# Patient Record
Sex: Male | Born: 1950 | Race: White | Hispanic: No | Marital: Married | Smoking: Former smoker
Health system: Southern US, Community
[De-identification: ages and names within clinical notes are randomized; demographics above are authoritative.]

## PROBLEM LIST (undated history)

## (undated) DIAGNOSIS — K649 Unspecified hemorrhoids: Secondary | ICD-10-CM

## (undated) DIAGNOSIS — Z9989 Dependence on other enabling machines and devices: Secondary | ICD-10-CM

## (undated) DIAGNOSIS — G4733 Obstructive sleep apnea (adult) (pediatric): Secondary | ICD-10-CM

## (undated) DIAGNOSIS — N189 Chronic kidney disease, unspecified: Secondary | ICD-10-CM

## (undated) DIAGNOSIS — D126 Benign neoplasm of colon, unspecified: Secondary | ICD-10-CM

## (undated) DIAGNOSIS — J302 Other seasonal allergic rhinitis: Secondary | ICD-10-CM

## (undated) DIAGNOSIS — Z973 Presence of spectacles and contact lenses: Secondary | ICD-10-CM

## (undated) DIAGNOSIS — F32A Depression, unspecified: Secondary | ICD-10-CM

## (undated) DIAGNOSIS — M5126 Other intervertebral disc displacement, lumbar region: Secondary | ICD-10-CM

## (undated) DIAGNOSIS — M109 Gout, unspecified: Secondary | ICD-10-CM

## (undated) DIAGNOSIS — K219 Gastro-esophageal reflux disease without esophagitis: Secondary | ICD-10-CM

## (undated) DIAGNOSIS — M1990 Unspecified osteoarthritis, unspecified site: Secondary | ICD-10-CM

## (undated) DIAGNOSIS — E7800 Pure hypercholesterolemia, unspecified: Secondary | ICD-10-CM

## (undated) DIAGNOSIS — N200 Calculus of kidney: Secondary | ICD-10-CM

## (undated) DIAGNOSIS — F329 Major depressive disorder, single episode, unspecified: Secondary | ICD-10-CM

## (undated) DIAGNOSIS — F419 Anxiety disorder, unspecified: Secondary | ICD-10-CM

## (undated) HISTORY — DX: Calculus of kidney: N20.0

## (undated) HISTORY — DX: Chronic kidney disease, unspecified: N18.9

## (undated) HISTORY — DX: Major depressive disorder, single episode, unspecified: F32.9

## (undated) HISTORY — DX: Dependence on other enabling machines and devices: Z99.89

## (undated) HISTORY — DX: Depression, unspecified: F32.A

## (undated) HISTORY — DX: Pure hypercholesterolemia, unspecified: E78.00

## (undated) HISTORY — DX: Obstructive sleep apnea (adult) (pediatric): G47.33

## (undated) HISTORY — DX: Anxiety disorder, unspecified: F41.9

## (undated) HISTORY — DX: Benign neoplasm of colon, unspecified: D12.6

## (undated) HISTORY — DX: Unspecified hemorrhoids: K64.9

## (undated) HISTORY — PX: COLONOSCOPY: SHX174

---

## 1994-05-21 HISTORY — PX: KIDNEY STONE SURGERY: SHX686

## 1994-05-21 HISTORY — PX: NASAL SINUS SURGERY: SHX719

## 2002-11-09 ENCOUNTER — Encounter (INDEPENDENT_AMBULATORY_CARE_PROVIDER_SITE_OTHER): Payer: Self-pay | Admitting: *Deleted

## 2002-11-09 ENCOUNTER — Ambulatory Visit (HOSPITAL_COMMUNITY): Admission: RE | Admit: 2002-11-09 | Discharge: 2002-11-09 | Payer: Self-pay | Admitting: Gastroenterology

## 2006-01-26 ENCOUNTER — Emergency Department (HOSPITAL_COMMUNITY): Admission: EM | Admit: 2006-01-26 | Discharge: 2006-01-26 | Payer: Self-pay | Admitting: Emergency Medicine

## 2006-05-21 HISTORY — PX: FINGER TENDON REPAIR: SHX1640

## 2007-02-02 ENCOUNTER — Inpatient Hospital Stay (HOSPITAL_COMMUNITY): Admission: EM | Admit: 2007-02-02 | Discharge: 2007-02-05 | Payer: Self-pay | Admitting: Emergency Medicine

## 2010-10-03 NOTE — Op Note (Signed)
NAMEJAMAREE, HOSIER            ACCOUNT NO.:  [id number]   MEDICAL RECORD NO.:  [id number]          PATIENT TYPE:  INP   LOCATION:  0104                         FACILITY:  [facility]   PHYSICIAN:  Madelynn Done, MD  DATE OF BIRTH:  Jan 23, 1951   DATE OF PROCEDURE:  02/02/2007  DATE OF DISCHARGE:                               OPERATIVE REPORT   ADDENDUM   DESCRIPTION OF PROCEDURE:  After, the flexor digitorum profundus was  then repaired without significant tension.  Attention was then returned  to revascularization of the finger.  Both the radial and ulnar digital  arteries were identified and the radial digital artery appeared to be in  better condition than the ulnar digital artery.  It was, therefore, felt  that a repair of the radial digital artery.  Using the microscope, the  radial digital artery was then carefully dissected free.  It was freed  up such that under loupe magnification we   Dictation ended at this point.      Madelynn Done, MD     FWO/MEDQ  D:  02/02/2007  T:  02/03/2007  Job:  [id number]

## 2010-10-03 NOTE — Op Note (Signed)
Zachary Alvarado, Zachary Alvarado            ACCOUNT NO.:  [id number]   MEDICAL RECORD NO.:  [id number]          PATIENT TYPE:  INP   LOCATION:  0104                         FACILITY:  [facility]   PHYSICIAN:  Madelynn Done, MD  DATE OF BIRTH:  1950-08-26   DATE OF PROCEDURE:  02/02/2007  DATE OF DISCHARGE:                               OPERATIVE REPORT   PREOPERATIVE DIAGNOSIS:  Left middle finger dysvascular digit from table  saw injury.   POSTOPERATIVE DIAGNOSIS:  Left middle finger dysvascular digit from  table saw injury.   ATTENDING SURGEON:  Dr. Bradly Bienenstock who was scrubbed and present for  the entire procedure.   ASSISTANT:  Dr. Onalee Hua who was scrubbed and present for the  revascularization.   PROCEDURE:  1. Left middle finger flexor digitorum superficialis repair, zone II.  2. Left middle finger zone II flexor digitorum profundus repair.  3. Left middle finger radial digital artery repair under the      microscope, microsurgical technique.  4. Left middle finger ulnar digital nerve repair.  5. Left middle finger radial digital nerve repair.   ANESTHESIA:  General via LMA.   TOURNIQUET TIME:  Total of an hour and 40 minutes.   SURGICAL INDICATIONS:  Mr. Luft is a 60 year old gentleman right-  handed who was at home using a table saw.  He sustained a transverse  laceration to his middle finger at the level of the middle phalanx.  The  patient was seen and evaluated in the emergency department and noted to  have poor circulation distal to his zone of injury as well as complete  anesthesia and no flexor digitorum profundus activity.  He was able to  flex the PIP joint with his other fingers held in extension.  Risks,  benefits and alternatives were discussed in detail with the patient and  signed informed consent was obtained.   INTRAOPERATIVE FINDINGS:  The patient had a complete laceration to both  neurovascular bundles.  The FDP was lacerated and retracted into  the  palm.  There was a near of 80% laceration of the both slips of the FDS  at the end of zone II.   Following repair of the digital artery, there was good flow distally to  the tip of the finger with good cap refill and skin turgor.   DESCRIPTION OF PROCEDURE:  The patient was brought identified in  preoperative holding area and a mark with a permanent marker was made on  the left middle finger to indicate correct operative site.  The patient  then brought back to the operating room, placed supine on the anesthesia  table.  General anesthesia was administered via endotracheal tube.  The  patient tolerated this well.  He received preoperative antibiotics prior  to any skin incision.  A well-padded tourniquet was then placed on the  left brachium and sealed with a 1000 drape.  The left upper extremity  was then prepped with Betadine and sterilely draped.  Time-out was  called, the correct side was identified and the procedure was then  begun.  The patient did have a  2.5 cm laceration across the middle  phalanx of the left middle finger.  This was extended both proximally  and distally along the radial ulnar borders in a Z type fashion.  This  allowed for adequate exposure of both neurovascular bundles well as the  flexor tendon sheath.  Attention was first turned to the repair of the  flexor digitorum superficialis.  Both slips were in continuity.  However, they were about 80% cut.  The slips of the FDS were then  reinforced with 4-0 FiberWire suture in a horizontal mattress fashion.  Following repair of the FDS tendon, attention was then turned to the  FDP.  It had retracted all the way down into the palm.  Therefore an  oblique incision was then made over the A1 pulley region.  The flexor  tendon was then identified.  Sutures were then placed into the tendon  and then using a feeding tube, the sutures were withdrawn distally,  maintaining the path in the course of the flexor digitorum  profundus.  After delivery of the tendon of the flexor digitorum profundus was then  repaired.  This was done with a modified Kessler technique with 4-0  FiberWire supplemented by a core horizantal mattress suture and 6.0  Prolene epitendinous suture.  There was not a significant amount of  tension on the repair.  Attention was then turned to repair of the  artery.   After the radial digital artery was identified, 2 small vessel clamps  were then applied both proximally and distally.  The tourniquet had been  deflated at this point.  There was good flow from the proximal end of  the artery.  The jagged ends of the artery were then trimmed back  sharply to get take back to healthy-appearing arterial tissue.  The  anastomosis was then performed from an end-to-end manner with 9-0 nylon  suture.  The ventral surface was applied which was sewn first; and then  the artery was flipped and turned; and then the back wall was then  repaired.  Following this repair with 9-0 nylon simple sutures, the  vessel clamps were then removed.  There was good blood distally; and not  a significant amount of bleeding at the arterial anastomosis.  There was  good skin turgor and good capillary refill.  During the arterial repair  lidocaine was used to infiltrate into the arterial wall itself, and then  papaverine was placed around the vessel to help decrease the spasm; and  the clot was extended the clot was removed both proximally and distally  with heparinized saline.   Following this the ulnar and radial digital nerves, which had been  isolated under the microscope as well, were then repaired with 3 nylon  epineurial sutures, simple sutures.  There was good reapproximation of  both digital nerves.  Following this, the finger was then thoroughly  irrigated.   Following repair of all the injured structures in the fingers, the  wounds were irrigated copiously.  The oblique incision in the palm was  then  closed with 5-0 nylon suture.  The laceration with the skin fixed,  the finger was then loosely reapproximated with 5-0 nylon suture.  Then  1/4% Marcaine 5 mL were then injected for a local digital block.  Adaptic dressing was then applied to the finger.  A sterile compressive  dressing was then applied, and a bulky hand splint was then made.  A  dorsal blocking splint all the way up to the  fingertips was then applied  to allow visibility of the fingertips.  The patient was extubated and  taken to recovery room in good condition.  The patient tolerated the  procedure well.  At the end of the procedure, there was good blood flow  to the fingertip with good and brisk capillary refill.   POSTOPERATIVE PLAN:  The patient will be admitted overnight, for the  next 48 hours.  Continue on enteric-coated aspirin, Dextran, warm room,  and revascularization precautions.  We will watch him closely; and then  if the fingertip does continue to survive, they will likely get into a  zone to flexor digitorum for tendon-repair protocol.      Madelynn Done, MD  Electronically Signed     FWO/MEDQ  D:  02/02/2007  T:  02/03/2007  Job:  [id number]

## 2010-10-03 NOTE — Op Note (Signed)
Zachary Alvarado, Zachary Alvarado            ACCOUNT NO.:  [id number]   MEDICAL RECORD NO.:  [id number]          PATIENT TYPE:  INP   LOCATION:  0104                         FACILITY:  [facility]   PHYSICIAN:  Madelynn Done, MD  DATE OF BIRTH:  [birth year]   DATE OF PROCEDURE:  02/02/2007  DATE OF DISCHARGE:                               OPERATIVE REPORT   ADDENDUM:   DESCRIPTION OF PROCEDURE:  After the radial digital artery was  identified, 2 small vessel clamps were then applied both proximally and  distally.  The tourniquet had been deflated at this point.  There was  good flow from the proximal end of the artery.  The jagged ends of the  artery were then trimmed back sharply to get take back to healthy-  appearing arterial tissue.  The anastomosis was then performed from an  end-to-end manner with 9-0 nylon suture.  The ventral surface was  applied which was sewn first; and then the artery was flipped and  turned; and then the back wall was then repaired.  Following this repair  with 9-0 nylon simple sutures, the vessel clamps were then removed.  There was good blood distally; and not a significant amount of bleeding  at the arterial anastomosis.  There was good skin turgor and good  capillary refill.   Following this the microscope was taken out.  The ulnar and radial  digital nerves, which had been isolated under the microscope as well,  were then repaired with 3 nylon epineurial sutures, simple sutures.  There was good reapproximation of both digital nerves.  Following this,  the finger was then thoroughly irrigated.  During the arterial repair  lidocaine was used to infiltrate into the arterial wall itself, and then  papaverine was placed around the vessel to help decrease the spasm; and  the clot was extended the clot was removed both proximally and distally  with heparinized saline.   Following repair of all the injured structures in the fingers, the  wounds were irrigated  copiously.  The oblique incision in the palm was  then closed with 5-0 nylon suture.  The laceration with the skin fixed,  the finger was then loosely reapproximated with 5-0 nylon suture.  Then  1/4% Marcaine 5 mL were then injected for a local digital block.  Adaptic dressing was then applied to the finger.  A sterile compressive  dressing was then applied, and a bulky hand splint was then made.  A  dorsal blocking splint all the way up to the fingertips was then applied  to allow visibility of the fingertips.  The patient was extubated and  taken to recovery room in good condition.  The patient tolerated the  procedure well.  At the end of the procedure, there was good blood flow  to the fingertip with good and brisk capillary refill.   POSTOPERATIVE PLAN:  The patient will be admitted overnight, for the  next 48 hours.  Continue on enteric-coated aspirin, Dextran, warm room,  and revascularization precautions.  We will watch him closely; and then  if the fingertip does continue to survive,  they will likely get into a  zone to flexor digitorum for tendon-repair protocol.      Madelynn Done, MD     FWO/MEDQ  D:  02/02/2007  T:  02/03/2007  Job:  [id number]

## 2010-10-03 NOTE — Discharge Summary (Signed)
Zachary Alvarado, YNIGUEZ            ACCOUNT NO.:  [id number]   MEDICAL RECORD NO.:  [id number]          PATIENT TYPE:  INP   LOCATION:  1607                         FACILITY:  [facility]   PHYSICIAN:  Madelynn Done, MD  DATE OF BIRTH:  05-Aug-1950   DATE OF ADMISSION:  02/02/2007  DATE OF DISCHARGE:  02/05/2007                               DISCHARGE SUMMARY   REASON FOR ADMISSION:  Left middle finger table saw injury with  dysvascular digit.   DISCHARGE DIAGNOSES:  Left middle finger table saw injury with injury to  nerves, arteries and tendons.   DISCHARGE MEDICATIONS:  1. Vicodin ES 1-2 tablets by mouth every 4-6 hours as needed for pain.  2. Enteric-coated aspirin 325 mg by mouth twice a day.  3. Colace 100 mg by mouth twice a day.   OPERATION/PROCEDURE:  Procedures and dates:  Left middle finger tendon, nerve and artery repair on February 02, 2007.   BRIEF HISTORY:  The patient is a 60 year old gentleman who sustained an  injury to his left middle finger while at home from a band saw.  He  presented to the emergency department with a dysvascular middle finger  from approximately the middle phalanx distally.  The patient was taken  to the operating room on the day of admission.   HOSPITAL COURSE:  The patient was admitted to the ortho floor following  the above procedure.  He felt tolerated this well under general  anesthesia.  Revascularization precautions were taken.  He was continued  on dextran, a warm room, no nicotine, and no caffeine.  He also was on  the aspirin.  He was continued on the IV pain medications, antibiotics  and Dextran through postoperative day #3.   Throughout his hospital course the fingertip remained well-perfused with  good capillary refill.  He was able to wiggle all digits.  He was  afebrile.  His vital signs were stable and normal throughout.  On  postoperative day #3 he was tolerating a regular diet, his pain was  controlled, the fingertip  was warm, and well-perfused; and, the patient  was ready to be discharged to home.   RECOMMENDATIONS AND DISPOSITION:  1. The patient is to be discharged to home.  2. I plan to see him back in the office in approximately 7-9 days.  3. The patient will continue with the above discharge medications.  4. The patient was given our telephone number and my cell phone number      so if he has any problems he is to give [place] a call.   All questions were addressed prior to his discharge.  The patient's was  questions were answered.  The patient voiced understanding of the  discharge instructions and the plan that we are to see him back in the  office.  He was instructed to call sooner if he has any worsening pain  or problems with that digit.   DISCHARGE CONDITION:  The condition at discharge is good.      Madelynn Done, MD  Electronically Signed     FWO/MEDQ  D:  02/05/2007  T:  02/06/2007  Job:  [id number]

## 2010-10-06 NOTE — Op Note (Signed)
Zachary Alvarado, Zachary Alvarado                        ACCOUNT NO.:  [id number]   MEDICAL RECORD NO.:  [id number]                   PATIENT TYPE:  AMB   LOCATION:  ENDO                                 FACILITY:  [facility]   PHYSICIAN:  Petra Kuba, M.D.                 DATE OF BIRTH:  [birth year]   DATE OF PROCEDURE:  11/09/2002  DATE OF DISCHARGE:                                 OPERATIVE REPORT   PROCEDURE PERFORMED:  Colonoscopy with polypectomy.   ENDOSCOPIST:  Petra Kuba, M.D.   INDICATIONS FOR PROCEDURE:  Screening.  History of colon polyps.  Due for  repeat screen.  Consent was signed after the risks, benefits, methods and  options were thoroughly discussed in the past.   MEDICINES USED:  Demerol 100 mg, Versed 10 mg.   DESCRIPTION OF PROCEDURE:  Rectal inspection was pertinent for small  external hemorrhoids.  Digital exam was negative.  A video colonoscope was  inserted and fairly easily advanced around the colon to the cecum.  This did  require some abdominal pressure and no position changes.  On insertion, no  obvious abnormality was seen.  The prep was better on the left than the  right.  There was some stool adherent to the wall on the right which could  not be washed off.  The cecum was identified by the appendiceal orifice and  the ileocecal valve.  The scope was slowly withdrawn.  The ileocecal  ascending and majority of the transverse were normal; however, in the  proximal level of the splenic flexure, a tiny polyp was seen, was hot  biopsied times one.  The scope was further withdrawn.  There was a rare left-  sided diverticula, but no other abnormality was seen as we withdrew back to  the rectum.  Anorectal pull-through and retroflexion confirmed some  hemorrhoids and ruled out any other etiology.  Scope was reinserted a short  ways up the left side of the colon.  Air was suctioned, scope removed.  The  patient tolerated the procedure well.  There was no immediate  obvious  complication.   ENDOSCOPIC DIAGNOSIS:  1. Small internal and external hemorrhoids.  2. Rare left-sided diverticula.  3. Tiny splenic flexure polyp hot biopsied.  4. Otherwise within normal limits to the cecum.    PLAN:  Await pathology.  Happy to see back p.r.n.  Otherwise return care to  Dr. Georgina Pillion for the customary health care maintenance to include yearly  rectals and guaiacs and repeat screening probably in five years.                                               Petra Kuba, M.D.    MEM/MEDQ  D:  11/09/2002  T:  11/09/2002  Job:  [id number]   cc:   Oley Balm. Georgina Pillion, M.D.  [address]  [place]  [place] [address]  Fax: [phone]

## 2011-03-01 LAB — BASIC METABOLIC PANEL
BUN: 7
CO2: 27
Calcium: 8.2 — ABNORMAL LOW
Chloride: 106
Creatinine, Ser: 1.19
GFR calc Af Amer: >60
GFR calc non Af Amer: >60
Glucose, Bld: 115 — ABNORMAL HIGH
Potassium: 3.6
Sodium: 138

## 2011-03-01 LAB — CBC
HCT: 33.3 — ABNORMAL LOW
Hemoglobin: 11.6 — ABNORMAL LOW
MCHC: 34.7
MCV: 89.8
Platelets: 192
RBC: 3.71 — ABNORMAL LOW
RDW: 12.3
WBC: 9.3

## 2011-03-02 LAB — COMPREHENSIVE METABOLIC PANEL
ALT: 24
AST: 22
Albumin: 3.9
Alkaline Phosphatase: 48
BUN: 15
CO2: 27
Calcium: 8.6
Chloride: 106
Creatinine, Ser: 1.22
GFR calc Af Amer: >60
GFR calc non Af Amer: >60
Glucose, Bld: 126 — ABNORMAL HIGH
Potassium: 4.5
Sodium: 138
Total Bilirubin: 0.9
Total Protein: 6.4

## 2011-03-02 LAB — CBC
HCT: 41
Hemoglobin: 14.5
MCHC: 35.2
MCV: 88.6
Platelets: 272
RBC: 4.63
RDW: 12.1
WBC: 9

## 2011-03-02 LAB — DIFFERENTIAL
Basophils Absolute: 0
Basophils Relative: 0
Eosinophils Absolute: 0.3
Eosinophils Relative: 3
Lymphocytes Relative: 12
Lymphs Abs: 1.1
Monocytes Absolute: 0.4
Monocytes Relative: 4
Neutro Abs: 7.2
Neutrophils Relative %: 80 — ABNORMAL HIGH

## 2011-03-02 LAB — PROTIME-INR
INR: 1.1
Prothrombin Time: 14.3

## 2011-03-02 LAB — APTT: aPTT: 26

## 2013-03-29 ENCOUNTER — Encounter: Payer: Self-pay | Admitting: Cardiology

## 2013-03-29 ENCOUNTER — Encounter: Payer: Self-pay | Admitting: Interventional Cardiology

## 2013-03-31 ENCOUNTER — Encounter (HOSPITAL_BASED_OUTPATIENT_CLINIC_OR_DEPARTMENT_OTHER): Payer: Self-pay | Admitting: *Deleted

## 2013-03-31 NOTE — Progress Notes (Signed)
No labs needed May have nasal packing-he does use a cpap with nose and mouth mask-he will bring it and will use post op. Has nasal polyps

## 2013-04-01 ENCOUNTER — Ambulatory Visit: Payer: Self-pay | Admitting: Cardiology

## 2013-04-01 ENCOUNTER — Encounter: Payer: Self-pay | Admitting: Cardiology

## 2013-04-01 DIAGNOSIS — E7800 Pure hypercholesterolemia, unspecified: Secondary | ICD-10-CM | POA: Insufficient documentation

## 2013-04-01 DIAGNOSIS — G4733 Obstructive sleep apnea (adult) (pediatric): Secondary | ICD-10-CM | POA: Insufficient documentation

## 2013-04-02 ENCOUNTER — Telehealth: Payer: Self-pay | Admitting: Cardiology

## 2013-04-02 NOTE — H&P (Signed)
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 Orders  CT Maxillofacial w/o contrast; Requested for: 03 Mar 2013. 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 We discussed different treatment options, antibiotics and steroids versus immediate surgery. He is inclined to consider revision surgery. He's done well for some years after his first operation. He wants to think about this and will let me know. We had a lengthy discussion about the surgery, the recovery, the risks of eye and brain injury. All questions were answered. Reason For Visit  Zachary Alvarado is here today at the kind request of none for consultation and opinion for sinus. HPI  History of polyp surgery in [place] about 20 years ago. He had done well until a few weeks ago when he started noticing pretty significant right-sided only nasal obstruction. He has some short periods of time when is clear but most of the time it's pretty obstructed. Nothing medicine-wise has helped. Allergies  Penicillins. Current Meds  Aspirin Low Dose 81 MG Oral Tablet;; RPT Carisoprodol TABS;; RPT Zoloft TABS (Sertraline HCl);; RPT Mobic TABS (Meloxicam);; RPT Red Yeast Rice CAPS;; RPT. PMH  History of renal calculi (V13.01) (Z87.442). PSH  Hand Surgery; finger Nose Surgery Oral Surgery Tooth Extraction. Family Hx  Family history of Alzheimer's disease: Father (V17.2) (Z82.0) Family history of malignant neoplasm: Mother (V16.9) (Z80.9) Seasonal allergies: Mother (J30.2). 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 ROS  Systemic: Not feeling tired (fatigue).  No fever, no night sweats, and no recent weight loss. Head: No headache. Eyes: No eye symptoms. Otolaryngeal: No hearing loss, no earache, no tinnitus, and no purulent nasal discharge.  Nasal passage blockage (stuffiness).  No snoring,  no sneezing, no hoarseness, and no sore throat. Cardiovascular: No chest pain or discomfort  and no palpitations. Pulmonary: No dyspnea, no cough, and no wheezing. Gastrointestinal: No dysphagia  and no heartburn.  No nausea, no abdominal pain, and no melena.  No diarrhea. Genitourinary: No dysuria. Endocrine: No muscle weakness. Musculoskeletal: No calf muscle cramps, no arthralgias, and no soft tissue swelling. Neurological: No dizziness, no fainting, no tingling, and no numbness. Psychological: No anxiety  and no depression. Skin: No rash. 12 system ROS was obtained and reviewed on the Health Maintenance form dated today.  Positive responses are shown above.  If the symptom is not checked, the patient has denied it. Vital Signs   Recorded by Skolimowski,Sharon on 03 Mar 2013 10:27 AM BP:110/70,  Height: 69 in, Weight: 185 lb, BMI: 27.3 kg/m2,  BMI Calculated: 27.32 ,  BSA Calculated: 2.00. Physical Exam  APPEARANCE: Well developed, well nourished, in no acute distress.  Normal affect, in a pleasant mood.  Oriented to time, place and person. COMMUNICATION: Normal voice   HEAD & FACE:  No scars, lesions or masses of head and face.  Sinuses nontender to palpation.  Salivary glands without mass or tenderness.  Facial strength symmetric.  No facial lesion, scars, or mass. EYES: EOMI with normal primary gaze alignment. Visual acuity grossly intact.  PERRLA EXTERNAL EAR & NOSE: No scars, lesions or masses  EAC & TYMPANIC MEMBRANE:  EAC shows no obstructing lesions or debris and tympanic membranes are normal bilaterally with good movement to insufflation. GROSS HEARING: Normal   TMJ:  Nontender  INTRANASAL EXAM: Left nasal cavity clear with healthy mucosa. Right side with polyps seen arising from the superior meatus  in the posterior nasal cavity. There may also be some arising from the middle meatus.  NASOPHARYNX: Normal, without lesions. LIPS, TEETH & GUMS: No lip lesions, normal dentition  and normal gums. ORAL CAVITY/OROPHARYNX:  Oral mucosa moist without lesion or asymmetry of the palate, tongue, tonsil or posterior pharynx. NECK:  Supple without adenopathy or mass. THYROID:  Normal with no masses palpable.  NEUROLOGIC:  No gross CN deficits. No nystagmus noted.   LYMPHATIC:  No enlarged nodes palpable. Results  CT reveals diffuse opacification of the right maxillary ethmoid and frontal sinus complex, with significant soft tissue density within the infundibulum, right nasal cavity and more mild disease on the left. Signature  Electronically signed by : Serena Colonel  M.D.; 03/03/2013 1:13 PM EST.

## 2013-04-02 NOTE — Telephone Encounter (Signed)
New Problem:  Pt states he is calling b/c he missed his appt and he was unaware of it. Pt states he thought he wasn't due for a checkup until months from now. Pt would like to be advised on when he is suppose to come in. Should he come in now or months from now. Also, Pt states,  could he send in sleep data without coming in for an appt. Please advise.Zachary Alvarado

## 2013-04-03 ENCOUNTER — Ambulatory Visit (HOSPITAL_BASED_OUTPATIENT_CLINIC_OR_DEPARTMENT_OTHER): Payer: BC Managed Care – PPO | Admitting: Anesthesiology

## 2013-04-03 ENCOUNTER — Encounter (HOSPITAL_BASED_OUTPATIENT_CLINIC_OR_DEPARTMENT_OTHER): Payer: Self-pay

## 2013-04-03 ENCOUNTER — Encounter (HOSPITAL_BASED_OUTPATIENT_CLINIC_OR_DEPARTMENT_OTHER)
Admission: RE | Disposition: A | Discharge status: Home / Self Care | Payer: Self-pay | Source: Ambulatory Visit | Attending: Otolaryngology

## 2013-04-03 ENCOUNTER — Encounter (HOSPITAL_BASED_OUTPATIENT_CLINIC_OR_DEPARTMENT_OTHER): Payer: BC Managed Care – PPO | Admitting: Anesthesiology

## 2013-04-03 ENCOUNTER — Ambulatory Visit (HOSPITAL_BASED_OUTPATIENT_CLINIC_OR_DEPARTMENT_OTHER)
Admission: RE | Admit: 2013-04-03 | Discharge: 2013-04-03 | Disposition: A | Discharge status: Home / Self Care | Payer: BC Managed Care – PPO | Source: Ambulatory Visit | Attending: Otolaryngology | Admitting: Otolaryngology

## 2013-04-03 DIAGNOSIS — J320 Chronic maxillary sinusitis: Secondary | ICD-10-CM | POA: Insufficient documentation

## 2013-04-03 DIAGNOSIS — J339 Nasal polyp, unspecified: Secondary | ICD-10-CM | POA: Insufficient documentation

## 2013-04-03 DIAGNOSIS — Z87442 Personal history of urinary calculi: Secondary | ICD-10-CM | POA: Insufficient documentation

## 2013-04-03 DIAGNOSIS — J329 Chronic sinusitis, unspecified: Secondary | ICD-10-CM

## 2013-04-03 HISTORY — PX: POLYPECTOMY: SHX149

## 2013-04-03 HISTORY — DX: Presence of spectacles and contact lenses: Z97.3

## 2013-04-03 HISTORY — PX: ETHMOIDECTOMY: SHX5197

## 2013-04-03 LAB — POCT HEMOGLOBIN-HEMACUE: Hemoglobin: 16 g/dL (ref 13.0–17.0)

## 2013-04-03 SURGERY — POLYPECTOMY, NASAL CAVITY
Anesthesia: General | Site: Nose | Wound class: Clean Contaminated

## 2013-04-03 MED ORDER — DEXAMETHASONE SODIUM PHOSPHATE 4 MG/ML IJ SOLN
INTRAMUSCULAR | Status: DC | PRN
  Administered 2013-04-03: 10 mg via INTRAVENOUS

## 2013-04-03 MED ORDER — EPHEDRINE SULFATE 50 MG/ML IJ SOLN
INTRAMUSCULAR | Status: DC | PRN
  Administered 2013-04-03 (×3): 10 mg via INTRAVENOUS

## 2013-04-03 MED ORDER — MIDAZOLAM HCL 5 MG/5ML IJ SOLN
INTRAMUSCULAR | Status: DC | PRN
  Administered 2013-04-03: 2 mg via INTRAVENOUS

## 2013-04-03 MED ORDER — HYDROCODONE-ACETAMINOPHEN 7.5-325 MG PO TABS: 1.0000 | ORAL_TABLET | Freq: Four times a day (QID) | ORAL | Status: DC | PRN

## 2013-04-03 MED ORDER — OXYMETAZOLINE HCL 0.05 % NA SOLN
NASAL | Status: DC | PRN
  Administered 2013-04-03: 1 via NASAL

## 2013-04-03 MED ORDER — LACTATED RINGERS IV SOLN
INTRAVENOUS | Status: DC
  Administered 2013-04-03 (×2): via INTRAVENOUS

## 2013-04-03 MED ORDER — OXYCODONE HCL 5 MG/5ML PO SOLN: 5.0000 mg | Freq: Once | ORAL | Status: DC | PRN

## 2013-04-03 MED ORDER — ONDANSETRON HCL 4 MG/2ML IJ SOLN
INTRAMUSCULAR | Status: DC | PRN
  Administered 2013-04-03: 4 mg via INTRAVENOUS

## 2013-04-03 MED ORDER — SUCCINYLCHOLINE CHLORIDE 20 MG/ML IJ SOLN
INTRAMUSCULAR | Status: DC | PRN
  Administered 2013-04-03: 100 mg via INTRAVENOUS

## 2013-04-03 MED ORDER — LIDOCAINE-EPINEPHRINE 1 %-1:100000 IJ SOLN
INTRAMUSCULAR | Status: DC | PRN
  Administered 2013-04-03: 8 mL

## 2013-04-03 MED ORDER — OXYMETAZOLINE HCL 0.05 % NA SOLN
NASAL | Status: AC
  Filled 2013-04-03: qty 15

## 2013-04-03 MED ORDER — PROPOFOL 10 MG/ML IV BOLUS
INTRAVENOUS | Status: DC | PRN
  Administered 2013-04-03: 200 mg via INTRAVENOUS

## 2013-04-03 MED ORDER — PROMETHAZINE HCL 25 MG RE SUPP: 25.0000 mg | Freq: Four times a day (QID) | RECTAL | Status: DC | PRN

## 2013-04-03 MED ORDER — PROPOFOL 10 MG/ML IV BOLUS
INTRAVENOUS | Status: AC
  Filled 2013-04-03: qty 20

## 2013-04-03 MED ORDER — FENTANYL CITRATE 0.05 MG/ML IJ SOLN
INTRAMUSCULAR | Status: AC
  Filled 2013-04-03: qty 4

## 2013-04-03 MED ORDER — OXYCODONE HCL 5 MG PO TABS: 5.0000 mg | ORAL_TABLET | Freq: Once | ORAL | Status: DC | PRN

## 2013-04-03 MED ORDER — FENTANYL CITRATE 0.05 MG/ML IJ SOLN
INTRAMUSCULAR | Status: DC | PRN
  Administered 2013-04-03: 50 ug via INTRAVENOUS
  Administered 2013-04-03: 100 ug via INTRAVENOUS

## 2013-04-03 MED ORDER — ONDANSETRON HCL 4 MG/2ML IJ SOLN: 4.0000 mg | Freq: Once | INTRAMUSCULAR | Status: DC | PRN

## 2013-04-03 MED ORDER — BACIT-POLY-NEO HC 1 % EX OINT
TOPICAL_OINTMENT | CUTANEOUS | Status: AC
  Filled 2013-04-03: qty 15

## 2013-04-03 MED ORDER — FENTANYL CITRATE 0.05 MG/ML IJ SOLN: 50.0000 ug | INTRAMUSCULAR | Status: DC | PRN

## 2013-04-03 MED ORDER — CLINDAMYCIN PHOSPHATE 600 MG/50ML IV SOLN
INTRAVENOUS | Status: DC | PRN
  Administered 2013-04-03: 600 mg via INTRAVENOUS

## 2013-04-03 MED ORDER — SODIUM CHLORIDE 0.9 % IV SOLN
INTRAVENOUS | Status: DC | PRN
  Administered 2013-04-03: 200 mL

## 2013-04-03 MED ORDER — CLINDAMYCIN HCL 300 MG PO CAPS: 300.0000 mg | ORAL_CAPSULE | Freq: Three times a day (TID) | ORAL | Status: DC

## 2013-04-03 MED ORDER — MIDAZOLAM HCL 2 MG/2ML IJ SOLN: 1.0000 mg | INTRAMUSCULAR | Status: DC | PRN

## 2013-04-03 MED ORDER — LIDOCAINE-EPINEPHRINE 1 %-1:100000 IJ SOLN
INTRAMUSCULAR | Status: AC
  Filled 2013-04-03: qty 1

## 2013-04-03 MED ORDER — CLINDAMYCIN PHOSPHATE 600 MG/50ML IV SOLN
INTRAVENOUS | Status: AC
  Filled 2013-04-03: qty 50

## 2013-04-03 MED ORDER — HYDROMORPHONE HCL PF 1 MG/ML IJ SOLN: 0.2500 mg | INTRAMUSCULAR | Status: DC | PRN

## 2013-04-03 MED ORDER — BACITRACIN ZINC 500 UNIT/GM EX OINT
TOPICAL_OINTMENT | CUTANEOUS | Status: AC
  Filled 2013-04-03: qty 9

## 2013-04-03 MED ORDER — MIDAZOLAM HCL 2 MG/2ML IJ SOLN
INTRAMUSCULAR | Status: AC
  Filled 2013-04-03: qty 2

## 2013-04-03 MED ORDER — BACIT-POLY-NEO HC 1 % EX OINT
TOPICAL_OINTMENT | CUTANEOUS | Status: DC | PRN
  Administered 2013-04-03: 1

## 2013-04-03 SURGICAL SUPPLY — 49 items
ATTRACTOMAT 16X20 MAGNETIC DRP (DRAPES) IMPLANT
BLADE RAD40 ROTATE 4M 4 5PK (BLADE) IMPLANT
BLADE RAD60 ROTATE M4 4 5PK (BLADE) IMPLANT
BLADE ROTATE RAD12 5PK M4 4MM (BLADE) IMPLANT
BLADE TRICUT ROTATE M4 4 5PK (BLADE) ×3 IMPLANT
BUR HS RAD FRONTAL 3 (BURR) IMPLANT
CANISTER SUC SOCK COL 7 IN (MISCELLANEOUS) ×3 IMPLANT
CANISTER SUCT 1200ML W/VALVE (MISCELLANEOUS) ×6 IMPLANT
CATH SINUS BALLN 7X16 (CATHETERS) IMPLANT
CATH SINUS GUIDE F-70 (CATHETERS) IMPLANT
CATH SINUS GUIDE M/110 (CATHETERS) IMPLANT
CATH SINUS IRRIGATION 2.0 (CATHETERS) IMPLANT
CORDS BIPOLAR (ELECTRODE) IMPLANT
DECANTER SPIKE VIAL GLASS SM (MISCELLANEOUS) IMPLANT
DRESSING ADAPTIC 1/2  N-ADH (PACKING) IMPLANT
DRESSING NASAL KENNEDY 3.5X.9 (MISCELLANEOUS) IMPLANT
DRSG NASAL KENNEDY 3.5X.9 (MISCELLANEOUS)
DRSG NASAL KENNEDY LMNT 8CM (GAUZE/BANDAGES/DRESSINGS) IMPLANT
DRSG NASOPORE 8CM (GAUZE/BANDAGES/DRESSINGS) ×3 IMPLANT
DRSG TELFA 3X8 NADH (GAUZE/BANDAGES/DRESSINGS) IMPLANT
GAUZE SPONGE 4X4 16PLY XRAY LF (GAUZE/BANDAGES/DRESSINGS) IMPLANT
GAUZE VASELINE FOILPK 1/2 X 72 (GAUZE/BANDAGES/DRESSINGS) IMPLANT
GLOVE ECLIPSE 7.5 STRL STRAW (GLOVE) ×3 IMPLANT
GLOVE SURG SS PI 7.0 STRL IVOR (GLOVE) ×3 IMPLANT
GOWN PREVENTION PLUS XLARGE (GOWN DISPOSABLE) ×6 IMPLANT
HEMOSTAT SURGICEL .5X2 ABSORB (HEMOSTASIS) IMPLANT
IV NS 1000ML (IV SOLUTION) ×1
IV NS 1000ML BAXH (IV SOLUTION) ×2 IMPLANT
IV NS 500ML (IV SOLUTION)
IV NS 500ML BAXH (IV SOLUTION) IMPLANT
NEEDLE 27GAX1X1/2 (NEEDLE) ×3 IMPLANT
NEEDLE SPNL 25GX3.5 QUINCKE BL (NEEDLE) IMPLANT
NS IRRIG 1000ML POUR BTL (IV SOLUTION) ×3 IMPLANT
PACK BASIN DAY SURGERY FS (CUSTOM PROCEDURE TRAY) ×3 IMPLANT
PACK ENT DAY SURGERY (CUSTOM PROCEDURE TRAY) ×3 IMPLANT
PATTIES SURGICAL .5 X3 (DISPOSABLE) ×3 IMPLANT
SLEEVE SCD COMPRESS KNEE MED (MISCELLANEOUS) ×3 IMPLANT
SOLUTION BUTLER CLEAR DIP (MISCELLANEOUS) ×3 IMPLANT
SPONGE GAUZE 2X2 8PLY STRL LF (GAUZE/BANDAGES/DRESSINGS) ×3 IMPLANT
SPONGE SURGIFOAM ABS GEL 12-7 (HEMOSTASIS) IMPLANT
SUT CHROMIC 4 0 P 3 18 (SUTURE) IMPLANT
SUT ETHILON 3 0 PS 1 (SUTURE) IMPLANT
SUT PLAIN 4 0 ~~LOC~~ 1 (SUTURE) IMPLANT
SUT SILK 3 0 PS 1 (SUTURE) IMPLANT
SYSTEM RELIEVA LUMA ILLUM (SINUPLASTY) IMPLANT
TOWEL OR 17X24 6PK STRL BLUE (TOWEL DISPOSABLE) ×3 IMPLANT
TUBE CONNECTING 20X1/4 (TUBING) ×3 IMPLANT
TUBE SALEM SUMP 16 FR W/ARV (TUBING) IMPLANT
YANKAUER SUCT BULB TIP NO VENT (SUCTIONS) ×3 IMPLANT

## 2013-04-03 NOTE — Anesthesia Preprocedure Evaluation (Signed)
Anesthesia Evaluation  Patient identified by MRN, date of birth, ID band Patient awake    Reviewed: Allergy & Precautions, H&P , NPO status , Patient's Chart, lab work & pertinent test results  Airway Mallampati: I TM Distance: >3 FB Neck ROM: Full    Dental  (+) Teeth Intact and Dental Advisory Given   Pulmonary sleep apnea and Continuous Positive Airway Pressure Ventilation ,  breath sounds clear to auscultation        Cardiovascular Rhythm:Regular Rate:Normal     Neuro/Psych    GI/Hepatic   Endo/Other    Renal/GU      Musculoskeletal   Abdominal   Peds  Hematology   Anesthesia Other Findings   Reproductive/Obstetrics                           Anesthesia Physical Anesthesia Plan  ASA: II  Anesthesia Plan: General   Post-op Pain Management:    Induction: Intravenous  Airway Management Planned: Oral ETT  Additional Equipment:   Intra-op Plan:   Post-operative Plan: Extubation in OR  Informed Consent: I have reviewed the patients History and Physical, chart, labs and discussed the procedure including the risks, benefits and alternatives for the proposed anesthesia with the patient or authorized representative who has indicated his/her understanding and acceptance.   Dental advisory given  Plan Discussed with: CRNA, Anesthesiologist and Surgeon  Anesthesia Plan Comments:         Anesthesia Quick Evaluation

## 2013-04-03 NOTE — Interval H&P Note (Signed)
History and Physical Interval Note:  04/03/2013 9:29 AM  Zachary Alvarado  has presented today for surgery, with the diagnosis of NASAL POLYPS AND CHRONIC MAXILLARY SINUSITIS  The various methods of treatment have been discussed with the patient and family. After consideration of risks, benefits and other options for treatment, the patient has consented to  Procedure(s): BILATERAL POLYPECTOMY NASAL (Bilateral) ETHMOIDECTOMY (N/A) as a surgical intervention .  The patient's history has been reviewed, patient examined, no change in status, stable for surgery.  I have reviewed the patient's chart and labs.  Questions were answered to the patient's satisfaction.     Erykah Lippert

## 2013-04-03 NOTE — Anesthesia Postprocedure Evaluation (Signed)
  Anesthesia Post-op Note  Patient: Zachary Alvarado  Procedure(s) Performed: Procedure(s): BILATERAL NASAL POLYPECTOMY  (Bilateral) ETHMOIDECTOMY (N/A)  Patient Location: PACU  Anesthesia Type:General  Level of Consciousness: awake, alert  and oriented  Airway and Oxygen Therapy: Patient Spontanous Breathing  Post-op Pain: none  Post-op Assessment: Post-op Vital signs reviewed  Post-op Vital Signs: Reviewed  Complications: No apparent anesthesia complications

## 2013-04-03 NOTE — Telephone Encounter (Signed)
LVM for pt to make aware he is due for a 1 year f/u with Dr Mayford Knife for CPAP machine (Sleep). He needs to come in for a visit and not just send in sleep info due to insurance reasons. We have to prove pt is compliant and he has to have at least one OV face to face with Provider yearly for insurance to keep paying for supplies and such. Told pt to call back and schedule appt when he can.

## 2013-04-03 NOTE — Transfer of Care (Signed)
Immediate Anesthesia Transfer of Care Note  Patient: Zachary Alvarado  Procedure(s) Performed: Procedure(s): BILATERAL NASAL POLYPECTOMY  (Bilateral) ETHMOIDECTOMY (N/A)  Patient Location: PACU  Anesthesia Type:General  Level of Consciousness: awake, alert  and oriented  Airway & Oxygen Therapy: Patient Spontanous Breathing and Patient connected to face mask oxygen  Post-op Assessment: Report given to PACU RN and Post -op Vital signs reviewed and stable  Post vital signs: Reviewed and stable  Complications: No apparent anesthesia complications

## 2013-04-03 NOTE — Anesthesia Procedure Notes (Signed)
Procedure Name: Intubation Date/Time: 04/03/2013 10:11 AM Performed by: Burna Cash Pre-anesthesia Checklist: Patient identified, Emergency Drugs available, Suction available and Patient being monitored Patient Re-evaluated:Patient Re-evaluated prior to inductionOxygen Delivery Method: Circle System Utilized Preoxygenation: Pre-oxygenation with 100% oxygen Intubation Type: IV induction Ventilation: Mask ventilation without difficulty Laryngoscope Size: Mac and 3 Grade View: Grade I Tube type: Oral Tube size: 8.0 mm Number of attempts: 1 Airway Equipment and Method: stylet and oral airway Placement Confirmation: ETT inserted through vocal cords under direct vision,  positive ETCO2 and breath sounds checked- equal and bilateral Secured at: 22 cm Tube secured with: Tape Dental Injury: Teeth and Oropharynx as per pre-operative assessment

## 2013-04-03 NOTE — Op Note (Signed)
OPERATIVE REPORT  DATE OF SURGERY: 04/03/2013  PATIENT:  Brunetta Genera,  62 y.o. male  PRE-OPERATIVE DIAGNOSIS:  NASAL POLYPS AND CHRONIC MAXILLARY SINUSITIS  POST-OPERATIVE DIAGNOSIS:  NASAL POLYPS AND CHRONIC MAXILLARY SINUSITIS  PROCEDURE:  Procedure(s): BILATERAL NASAL POLYPECTOMY  BILATERAL ENDOSCOPIC ETHMOIDECTOMY, LEFT ENDOSCOPIC MAXILLARY ANTROSTOMY  SURGEON:  Susy Frizzle, MD  ASSISTANTS: none  ANESTHESIA:   General   EBL:  50 ml  DRAINS: None   LOCAL MEDICATIONS USED:  1% Xylocaine with epinephrine  SPECIMEN:  Bilateral nasal and sinus contents  COUNTS:  Correct  PROCEDURE DETAILS: The patient was taken to the operating room and placed on the operating table in the supine position. Following induction of general endotracheal anesthesia the face was draped in a standard fashion. Oxymetazoline spray was used preoperatively in the nasal cavities. 1% Xylocaine with epinephrine was infiltrated into the posterior and superior attachments of the middle turbinates. The lateral nasal wall was also infiltrated. 1-bilateral endoscopic nasal polypectomy. Using a 0 nasal endoscope and the microdebrider, extensive polypoid disease was removed from the middle and superior meatus bilaterally. The middle turbinate was kept intact bilaterally.  2-bilateral endoscopic total ethmoidectomy. Using a combination of 0 and 30 nasal endoscopes, microdebrider and additional sinus instruments, a complete ethmoid dissection was accomplished bilaterally. There was extensive polypoid disease present throughout most of the ethmoid cells bilaterally. The lateral limit of dissection was the lamina papyracea which was kept intact. The superior limit was the fovea which was also kept intact. Frontal recess was dissected of polypoid disease bilaterally. The ground lamella was taken down bilaterally exposing posterior cells. At the end of the procedure, nasal poor packing was placed bilaterally in the  ethmoid cavity.  3-left endoscopic maxillary antrostomy with removal of tissue. The left maxillary sinus was completely filled with polypoid disease, and purulent material. This was suctioned out, and the angled curette was used to remove the diseased mucosa from the anterior part of the antrum. The left maxillary sinus was packed with bacitracin ointment at the end of the procedure. Patient was then awakened extubated and transferred to recovery in stable condition.    PATIENT DISPOSITION:  To PACU, stable

## 2013-04-06 ENCOUNTER — Encounter (HOSPITAL_BASED_OUTPATIENT_CLINIC_OR_DEPARTMENT_OTHER): Payer: Self-pay | Admitting: Otolaryngology

## 2013-04-08 ENCOUNTER — Encounter: Payer: Self-pay | Admitting: Cardiology

## 2013-04-08 ENCOUNTER — Ambulatory Visit (INDEPENDENT_AMBULATORY_CARE_PROVIDER_SITE_OTHER): Payer: BC Managed Care – PPO | Admitting: Cardiology

## 2013-04-08 VITALS — BP 119/83 | HR 75 | Ht 69.0 in | Wt 186.0 lb

## 2013-04-08 DIAGNOSIS — G4733 Obstructive sleep apnea (adult) (pediatric): Secondary | ICD-10-CM

## 2013-04-08 NOTE — Patient Instructions (Signed)
Your physician recommends that you continue on your current medications as directed. Please refer to the Current Medication list given to you today.  Your physician wants you to follow-up in: 1 year with Dr. Turner. You will receive a reminder letter in the mail two months in advance. If you don't receive a letter, please call our office to schedule the follow-up appointment.  

## 2013-04-08 NOTE — Progress Notes (Addendum)
[address] [place], [place]  [address] Phone: [phone] Fax:  [phone]  Date:  09/16/2020   ID:  Zachary Alvarado., DOB 23-Feb-1951, MRN [id number]  PCP:  Darrow Bussing, MD  Sleep Medicine:  Armanda Magic, MD    History of Present Illness: Zachary Alvarado. is a 62 y.o. male with a history of OSA who presents today for followup of his OSA.  He is doing well today.  He tolerates his CPAP device well.  He tolerate the mask and feels the pressure is adequate.  He feels rested when he gets up in the am and has no daytime sleepiness.  He uses a full face mask.  He recently had nasal polyp surgery and is doing well with recovery.   Wt Readings from Last 3 Encounters:  09/07/20 179 lb 3.2 oz (81.3 kg)  09/14/19 180 lb (81.6 kg)  09/12/18 179 lb (81.2 kg)       Current Outpatient Medications  Medication Sig Dispense Refill  . aspirin 81 MG tablet Take 81 mg by mouth daily.    Marland Kitchen azelastine (ASTELIN) 137 MCG/SPRAY nasal spray Place 2 sprays into both nostrils 2 (two) times daily as needed for allergies. Use in each nostril as directed    . carisoprodol (SOMA) 350 MG tablet Take 350 mg by mouth daily as needed for muscle spasms. Take as directed    . allopurinol (ZYLOPRIM) 100 MG tablet Take 100 mg by mouth daily.    . colchicine 0.6 MG tablet Take 0.6 mg by mouth 2 (two) times daily as needed (gout flare up). Gout flare up  0  . diclofenac sodium (VOLTAREN) 1 % GEL Apply 2 g topically daily as needed (pain).    . Multiple Vitamin (MULTIVITAMIN) tablet Take 1 tablet by mouth daily.    . Triprolidine-Pseudoephedrine (ANTIHISTAMINE PO) Take 1 tablet by mouth as directed.    Marland Kitchen VITAMIN D PO Take 4,000 Units by mouth daily.     No current facility-administered medications for this visit.    Allergies:    Allergies  Allergen Reactions  . Wasp Venom Swelling    Anything that stings will cause him to swell  . Percocet [Oxycodone-Acetaminophen] Other (See  Comments)    Dizziness   . Penicillins Rash    PATIENT ABLE TO TAKE KEFLEX W/O REACTION    Social History:  The patient  reports that he has never smoked. He has never used smokeless tobacco. He reports current alcohol use. He reports that he does not use drugs.   Family History:  The patient's family history includes Alzheimer's disease in his father; Hypertension in his sister; Leukemia in his mother.   ROS:  Please see the history of present illness.      All other systems reviewed and negative.   PHYSICAL EXAM: VS:  BP 119/83   Pulse 75   Ht 5\' 9"  (1.753 m)   Wt 186 lb (84.4 kg)   BMI 27.47 kg/m  Well nourished, well developed, in no acute distress HEENT: normal Neck: no JVD Cardiac:  normal S1, S2; RRR; no murmur Lungs:  clear to auscultation bilaterally, no wheezing, rhonchi or rales Abd: soft, nontender, no hepatomegaly Ext: no edema Skin: warm and dry Neuro:  CNs 2-12 intact, no focal abnormalities noted       ASSESSMENT AND PLAN:  1. OSA on CPAP - his download today showed an AHI of 4.4/hr on 11cm H2O and 84% compliance in  using more than 4 hours nightly.  I have encouraged him to get back into his exercise regimen.  Followup with me in 1 year  Signed, Armanda Magic, MD 09/16/2020 10:53 AM

## 2014-02-10 ENCOUNTER — Other Ambulatory Visit: Payer: Self-pay | Admitting: Dermatology

## 2014-02-23 ENCOUNTER — Encounter: Payer: Self-pay | Admitting: Cardiology

## 2014-12-27 ENCOUNTER — Encounter (HOSPITAL_COMMUNITY): Payer: Self-pay | Admitting: Emergency Medicine

## 2014-12-27 ENCOUNTER — Emergency Department (HOSPITAL_COMMUNITY)
Admission: EM | Admit: 2014-12-27 | Discharge: 2014-12-27 | Disposition: A | Discharge status: Home / Self Care | Payer: BC Managed Care – PPO | Attending: Emergency Medicine | Admitting: Emergency Medicine

## 2014-12-27 ENCOUNTER — Emergency Department (HOSPITAL_COMMUNITY): Payer: BC Managed Care – PPO

## 2014-12-27 DIAGNOSIS — J0101 Acute recurrent maxillary sinusitis: Secondary | ICD-10-CM | POA: Insufficient documentation

## 2014-12-27 DIAGNOSIS — N189 Chronic kidney disease, unspecified: Secondary | ICD-10-CM | POA: Diagnosis not present

## 2014-12-27 DIAGNOSIS — R42 Dizziness and giddiness: Secondary | ICD-10-CM | POA: Insufficient documentation

## 2014-12-27 DIAGNOSIS — Z7982 Long term (current) use of aspirin: Secondary | ICD-10-CM | POA: Insufficient documentation

## 2014-12-27 DIAGNOSIS — Z9981 Dependence on supplemental oxygen: Secondary | ICD-10-CM | POA: Insufficient documentation

## 2014-12-27 DIAGNOSIS — Z8639 Personal history of other endocrine, nutritional and metabolic disease: Secondary | ICD-10-CM | POA: Insufficient documentation

## 2014-12-27 DIAGNOSIS — R112 Nausea with vomiting, unspecified: Secondary | ICD-10-CM | POA: Diagnosis present

## 2014-12-27 DIAGNOSIS — Z79899 Other long term (current) drug therapy: Secondary | ICD-10-CM | POA: Insufficient documentation

## 2014-12-27 DIAGNOSIS — G4733 Obstructive sleep apnea (adult) (pediatric): Secondary | ICD-10-CM | POA: Insufficient documentation

## 2014-12-27 DIAGNOSIS — Z880 Allergy status to penicillin: Secondary | ICD-10-CM | POA: Diagnosis not present

## 2014-12-27 DIAGNOSIS — Z8719 Personal history of other diseases of the digestive system: Secondary | ICD-10-CM | POA: Insufficient documentation

## 2014-12-27 DIAGNOSIS — Z87442 Personal history of urinary calculi: Secondary | ICD-10-CM | POA: Diagnosis not present

## 2014-12-27 DIAGNOSIS — Z86018 Personal history of other benign neoplasm: Secondary | ICD-10-CM | POA: Diagnosis not present

## 2014-12-27 LAB — URINALYSIS, ROUTINE W REFLEX MICROSCOPIC
Bilirubin Urine: NEGATIVE
Glucose, UA: NEGATIVE mg/dL
Hgb urine dipstick: NEGATIVE
Ketones, ur: 15 mg/dL — AB
Leukocytes, UA: NEGATIVE
Nitrite: NEGATIVE
Protein, ur: NEGATIVE mg/dL
Specific Gravity, Urine: 1.02 (ref 1.005–1.030)
Urobilinogen, UA: 0.2 mg/dL (ref 0.0–1.0)
pH: 5.5 (ref 5.0–8.0)

## 2014-12-27 LAB — CBC
HCT: 43.8 % (ref 39.0–52.0)
Hemoglobin: 15.1 g/dL (ref 13.0–17.0)
MCH: 30.5 pg (ref 26.0–34.0)
MCHC: 34.5 g/dL (ref 30.0–36.0)
MCV: 88.5 fL (ref 78.0–100.0)
Platelets: 269 10*3/uL (ref 150–400)
RBC: 4.95 MIL/uL (ref 4.22–5.81)
RDW: 12.2 % (ref 11.5–15.5)
WBC: 9.2 10*3/uL (ref 4.0–10.5)

## 2014-12-27 LAB — LIPASE, BLOOD: Lipase: 17 U/L — ABNORMAL LOW (ref 22–51)

## 2014-12-27 LAB — COMPREHENSIVE METABOLIC PANEL
ALT: 28 U/L (ref 17–63)
AST: 23 U/L (ref 15–41)
Albumin: 4.3 g/dL (ref 3.5–5.0)
Alkaline Phosphatase: 66 U/L (ref 38–126)
Anion gap: 7 (ref 5–15)
BUN: 14 mg/dL (ref 6–20)
CO2: 25 mmol/L (ref 22–32)
Calcium: 9 mg/dL (ref 8.9–10.3)
Chloride: 106 mmol/L (ref 101–111)
Creatinine, Ser: 1.12 mg/dL (ref 0.61–1.24)
GFR calc Af Amer: >60 mL/min (ref >60)
GFR calc non Af Amer: >60 mL/min (ref >60)
Glucose, Bld: 159 mg/dL — ABNORMAL HIGH (ref 65–99)
Potassium: 4.4 mmol/L (ref 3.5–5.1)
Sodium: 138 mmol/L (ref 135–145)
Total Bilirubin: 1.2 mg/dL (ref 0.3–1.2)
Total Protein: 7.4 g/dL (ref 6.5–8.1)

## 2014-12-27 MED ORDER — ONDANSETRON HCL 4 MG PO TABS: 4.0000 mg | ORAL_TABLET | Freq: Four times a day (QID) | ORAL | Status: DC

## 2014-12-27 MED ORDER — MECLIZINE HCL 25 MG PO TABS
25.0000 mg | ORAL_TABLET | Freq: Once | ORAL | Status: AC
  Administered 2014-12-27: 25 mg via ORAL
  Filled 2014-12-27: qty 1

## 2014-12-27 MED ORDER — AZITHROMYCIN 250 MG PO TABS: 250.0000 mg | ORAL_TABLET | Freq: Every day | ORAL | Status: DC

## 2014-12-27 MED ORDER — AZITHROMYCIN 250 MG PO TABS
500.0000 mg | ORAL_TABLET | Freq: Once | ORAL | Status: AC
  Administered 2014-12-27: 500 mg via ORAL
  Filled 2014-12-27: qty 2

## 2014-12-27 MED ORDER — MECLIZINE HCL 25 MG PO TABS: 25.0000 mg | ORAL_TABLET | Freq: Three times a day (TID) | ORAL | Status: DC | PRN

## 2014-12-27 NOTE — Discharge Instructions (Signed)
Vertigo Vertigo means you feel like you or your surroundings are moving when they are not. Vertigo can be dangerous if it occurs when you are at work, driving, or performing difficult activities.  CAUSES  Vertigo occurs when there is a conflict of signals sent to your brain from the visual and sensory systems in your body. There are many different causes of vertigo, including:  Infections, especially in the inner ear.  A bad reaction to a drug or misuse of alcohol and medicines.  Withdrawal from drugs or alcohol.  Rapidly changing positions, such as lying down or rolling over in bed.  A migraine headache.  Decreased blood flow to the brain.  Increased pressure in the brain from a head injury, infection, tumor, or bleeding. SYMPTOMS  You may feel as though the world is spinning around or you are falling to the ground. Because your balance is upset, vertigo can cause nausea and vomiting. You may have involuntary eye movements (nystagmus). DIAGNOSIS  Vertigo is usually diagnosed by physical exam. If the cause of your vertigo is unknown, your caregiver may perform imaging tests, such as an MRI scan (magnetic resonance imaging). TREATMENT  Most cases of vertigo resolve on their own, without treatment. Depending on the cause, your caregiver may prescribe certain medicines. If your vertigo is related to body position issues, your caregiver may recommend movements or procedures to correct the problem. In rare cases, if your vertigo is caused by certain inner ear problems, you may need surgery. HOME CARE INSTRUCTIONS   Follow your caregiver's instructions.  Avoid driving.  Avoid operating heavy machinery.  Avoid performing any tasks that would be dangerous to you or others during a vertigo episode.  Tell your caregiver if you notice that certain medicines seem to be causing your vertigo. Some of the medicines used to treat vertigo episodes can actually make them worse in some people. SEEK  IMMEDIATE MEDICAL CARE IF:   Your medicines do not relieve your vertigo or are making it worse.  You develop problems with talking, walking, weakness, or using your arms, hands, or legs.  You develop severe headaches.  Your nausea or vomiting continues or gets worse.  You develop visual changes.  A family member notices behavioral changes.  Your condition gets worse. MAKE SURE YOU:  Understand these instructions.  Will watch your condition.  Will get help right away if you are not doing well or get worse. Document Released: 02/14/2005 Document Revised: 07/30/2011 Document Reviewed: 11/23/2010 [facility] Patient Information 2015 [place], [place]. This information is not intended to replace advice given to you by your health care provider. Make sure you discuss any questions you have with your health care provider. Sinusitis Sinusitis is redness, soreness, and inflammation of the paranasal sinuses. Paranasal sinuses are air pockets within the bones of your face (beneath the eyes, the middle of the forehead, or above the eyes). In healthy paranasal sinuses, mucus is able to drain out, and air is able to circulate through them by way of your nose. However, when your paranasal sinuses are inflamed, mucus and air can become trapped. This can allow bacteria and other germs to grow and cause infection. Sinusitis can develop quickly and last only a short time (acute) or continue over a long period (chronic). Sinusitis that lasts for more than 12 weeks is considered chronic.  CAUSES  Causes of sinusitis include:  Allergies.  Structural abnormalities, such as displacement of the cartilage that separates your nostrils (deviated septum), which can decrease the  air flow through your nose and sinuses and affect sinus drainage.  Functional abnormalities, such as when the small hairs (cilia) that line your sinuses and help remove mucus do not work properly or are not present. SIGNS AND SYMPTOMS    Symptoms of acute and chronic sinusitis are the same. The primary symptoms are pain and pressure around the affected sinuses. Other symptoms include:  Upper toothache.  Earache.  Headache.  Bad breath.  Decreased sense of smell and taste.  A cough, which worsens when you are lying flat.  Fatigue.  Fever.  Thick drainage from your nose, which often is green and may contain pus (purulent).  Swelling and warmth over the affected sinuses. DIAGNOSIS  Your health care provider will perform a physical exam. During the exam, your health care provider may:  Look in your nose for signs of abnormal growths in your nostrils (nasal polyps).  Tap over the affected sinus to check for signs of infection.  View the inside of your sinuses (endoscopy) using an imaging device that has a light attached (endoscope). If your health care provider suspects that you have chronic sinusitis, one or more of the following tests may be recommended:  Allergy tests.  Nasal culture. A sample of mucus is taken from your nose, sent to a lab, and screened for bacteria.  Nasal cytology. A sample of mucus is taken from your nose and examined by your health care provider to determine if your sinusitis is related to an allergy. TREATMENT  Most cases of acute sinusitis are related to a viral infection and will resolve on their own within 10 days. Sometimes medicines are prescribed to help relieve symptoms (pain medicine, decongestants, nasal steroid sprays, or saline sprays).  However, for sinusitis related to a bacterial infection, your health care provider will prescribe antibiotic medicines. These are medicines that will help kill the bacteria causing the infection.  Rarely, sinusitis is caused by a fungal infection. In theses cases, your health care provider will prescribe antifungal medicine. For some cases of chronic sinusitis, surgery is needed. Generally, these are cases in which sinusitis recurs more than 3  times per year, despite other treatments. HOME CARE INSTRUCTIONS   Drink plenty of water. Water helps thin the mucus so your sinuses can drain more easily.  Use a humidifier.  Inhale steam 3 to 4 times a day (for example, sit in the bathroom with the shower running).  Apply a warm, moist washcloth to your face 3 to 4 times a day, or as directed by your health care provider.  Use saline nasal sprays to help moisten and clean your sinuses.  Take medicines only as directed by your health care provider.  If you were prescribed either an antibiotic or antifungal medicine, finish it all even if you start to feel better. SEEK IMMEDIATE MEDICAL CARE IF:  You have increasing pain or severe headaches.  You have nausea, vomiting, or drowsiness.  You have swelling around your face.  You have vision problems.  You have a stiff neck.  You have difficulty breathing. MAKE SURE YOU:   Understand these instructions.  Will watch your condition.  Will get help right away if you are not doing well or get worse. Document Released: 05/07/2005 Document Revised: 09/21/2013 Document Reviewed: 05/22/2011 [facility] Patient Information 2015 [place], [place]. This information is not intended to replace advice given to you by your health care provider. Make sure you discuss any questions you have with your health care provider.

## 2014-12-27 NOTE — ED Notes (Signed)
Patient refusing IV.

## 2014-12-27 NOTE — ED Provider Notes (Signed)
CSN: [id number]     Arrival date & time 12/27/14  1122 History   First MD Initiated Contact with Patient 12/27/14 1539     Chief Complaint  Patient presents with  . Emesis  . Nausea  . Dizziness  . Exposure to rat      (Consider location/radiation/quality/duration/timing/severity/associated sxs/prior Treatment) HPI Comments: Patient with an uncomplicated medical history presents with sudden onset room-spinning dizziness after rolling over in bed last night. The episode was associated with nausea, vomiting and diaphoresis. Since onset, symptoms have improved and he reports much less dizziness and no nausea. No fever at any time. He denies chest pain, SOB, cough, headache or history of vertigo. He states he became concerned because he was exposed to rodent droppings and removed dead mice caught in set traps in his attic yesterday. He and his wife was concerned about a connection between his symptoms and possible viral illnesses associated with the exposure.  Patient is a 64 y.o. male presenting with vomiting and dizziness. The history is provided by the patient and the spouse. No language interpreter was used.  Emesis Severity:  Moderate Associated symptoms: no abdominal pain, no chills and no headaches   Dizziness Associated symptoms: nausea and vomiting   Associated symptoms: no headaches     Past Medical History  Diagnosis Date  . Depression   . Anxiety     social   . Adenomatous colon polyp     Dr Watt Climes 2004  . Wears glasses   . OSA on CPAP     withAHI 2/hr-titrated to 13cm H2O  . Chronic kidney disease   . Kidney stones, calcium oxalate   . Hemorrhoid   . Hypercholesterolemia    Past Surgical History  Procedure Laterality Date  . Nasal sinus surgery  1996  . Kidney stone surgery  1996    calcium oxalate kidney stone  . Colonoscopy    . Finger tendon repair  2008    and arteries-lt middle finger  . Polypectomy Bilateral 04/03/2013    Procedure: BILATERAL NASAL  POLYPECTOMY ;  Surgeon: Izora Gala, MD;  Location: [place];  Service: ENT;  Laterality: Bilateral;  . Ethmoidectomy N/A 04/03/2013    Procedure: ETHMOIDECTOMY;  Surgeon: Izora Gala, MD;  Location: [place];  Service: ENT;  Laterality: N/A;   Family History  Problem Relation Age of Onset  . Leukemia Mother   . Alzheimer's disease Father   . Hypertension Sister    History  Substance Use Topics  . Smoking status: Never Smoker   . Smokeless tobacco: Not on file  . Alcohol Use: Yes     Comment: 2 drinks daily    Review of Systems  Constitutional: Negative for fever and chills.  HENT: Negative.   Eyes: Negative for visual disturbance.  Respiratory: Negative.   Cardiovascular: Negative.   Gastrointestinal: Positive for nausea and vomiting. Negative for abdominal pain.  Musculoskeletal: Negative.   Skin: Negative.   Neurological: Positive for dizziness. Negative for syncope and headaches.      Allergies  Penicillins  Home Medications   Prior to Admission medications   Medication Sig Start Date End Date Taking? Authorizing Provider  aspirin 81 MG tablet Take 81 mg by mouth daily.    Historical Provider, MD  azelastine (ASTELIN) 137 MCG/SPRAY nasal spray Place into both nostrils 2 (two) times daily. Use in each nostril as directed    Historical Provider, MD  carisoprodol (SOMA) 350 MG tablet 350 mg. Take  as directed    Historical Provider, MD  Cetirizine HCl (ZYRTEC ALLERGY) 10 MG CAPS Take by mouth.    Historical Provider, MD  NON FORMULARY CPAP Machine as directed    Historical Provider, MD  psyllium (METAMUCIL) 58.6 % powder As neded for constipation    Historical Provider, MD  Red Yeast Rice 600 MG CAPS Take by mouth.    Historical Provider, MD  sertraline (ZOLOFT) 100 MG tablet Take 100 mg by mouth daily.    Historical Provider, MD   BP 150/89 mmHg  Pulse 73  Temp(Src) 97.8 F (36.6 C) (Oral)  Resp 16  SpO2 99% Physical Exam   Constitutional: He is oriented to person, place, and time. He appears well-developed and well-nourished.  HENT:  Head: Normocephalic and atraumatic.  Eyes: EOM are normal. Pupils are equal, round, and reactive to light.  Neck: Normal range of motion.  Cardiovascular: Normal rate and regular rhythm.   No murmur heard. Pulmonary/Chest: Effort normal and breath sounds normal. He has no wheezes. He has no rales.  Abdominal: Soft. There is no tenderness.  Neurological: He is alert and oriented to person, place, and time. He has normal strength and normal reflexes. No sensory deficit. He displays a negative Romberg sign.  No lateralizing weakness or deficits of coordination (finger-to-nose). Speech clear, focused and oriented. DTR's equal. Strength equal and 5/5 in extremities.     ED Course  Procedures (including critical care time) Labs Review Labs Reviewed  LIPASE, BLOOD - Abnormal; Notable for the following:    Lipase 17 (*)    All other components within normal limits  COMPREHENSIVE METABOLIC PANEL - Abnormal; Notable for the following:    Glucose, Bld 159 (*)    All other components within normal limits  CBC  URINALYSIS, ROUTINE W REFLEX MICROSCOPIC (NOT AT [facility])   Results for orders placed or performed during the hospital encounter of 12/27/14  Lipase, blood  Result Value Ref Range   Lipase 17 (L) 22 - 51 U/L  Comprehensive metabolic panel  Result Value Ref Range   Sodium 138 135 - 145 mmol/L   Potassium 4.4 3.5 - 5.1 mmol/L   Chloride 106 101 - 111 mmol/L   CO2 25 22 - 32 mmol/L   Glucose, Bld 159 (H) 65 - 99 mg/dL   BUN 14 6 - 20 mg/dL   Creatinine, Ser 1.12 0.61 - 1.24 mg/dL   Calcium 9.0 8.9 - 10.3 mg/dL   Total Protein 7.4 6.5 - 8.1 g/dL   Albumin 4.3 3.5 - 5.0 g/dL   AST 23 15 - 41 U/L   ALT 28 17 - 63 U/L   Alkaline Phosphatase 66 38 - 126 U/L   Total Bilirubin 1.2 0.3 - 1.2 mg/dL   GFR calc non Af Amer >60 >60 mL/min   GFR calc Af Amer >60 >60 mL/min   Anion  gap 7 5 - 15  CBC  Result Value Ref Range   WBC 9.2 4.0 - 10.5 K/uL   RBC 4.95 4.22 - 5.81 MIL/uL   Hemoglobin 15.1 13.0 - 17.0 g/dL   HCT 43.8 39.0 - 52.0 %   MCV 88.5 78.0 - 100.0 fL   MCH 30.5 26.0 - 34.0 pg   MCHC 34.5 30.0 - 36.0 g/dL   RDW 12.2 11.5 - 15.5 %   Platelets 269 150 - 400 K/uL  Urinalysis, Routine w reflex microscopic (not at [facility])  Result Value Ref Range   Color, Urine AMBER (A) YELLOW  APPearance CLEAR CLEAR   Specific Gravity, Urine 1.020 1.005 - 1.030   pH 5.5 5.0 - 8.0   Glucose, UA NEGATIVE NEGATIVE mg/dL   Hgb urine dipstick NEGATIVE NEGATIVE   Bilirubin Urine NEGATIVE NEGATIVE   Ketones, ur 15 (A) NEGATIVE mg/dL   Protein, ur NEGATIVE NEGATIVE mg/dL   Urobilinogen, UA 0.2 0.0 - 1.0 mg/dL   Nitrite NEGATIVE NEGATIVE   Leukocytes, UA NEGATIVE NEGATIVE   Ct Head Wo Contrast  12/27/2014   CLINICAL DATA:  Nausea, vomiting and dizziness beginning at 3 a.m. this morning.  EXAM: CT HEAD WITHOUT CONTRAST  TECHNIQUE: Contiguous axial images were obtained from the base of the skull through the vertex without intravenous contrast.  COMPARISON:  None.  FINDINGS: The brain appears normal without hemorrhage, infarct, mass lesion, mass effect, midline shift or abnormal extra-axial fluid collection. No hydrocephalus or pneumocephalus. The calvarium is intact.  Marked mucosal thickening is seen in the maxillary sinuses bilaterally, worse on the right, where an air-fluid level is identified. Mucosal thickening is also seen in scattered ethmoid air cells and in the frontal sinuses. The mastoid air cells are clear.  IMPRESSION: No acute intracranial abnormality.  Marked sinus disease with an air-fluid level in the right maxillary compatible with acute sinusitis.   Electronically Signed   By: Inge Rise M.D.   On: 12/27/2014 17:27    Imaging Review No results found.   EKG Interpretation None      MDM   Final diagnoses:  None    1. Acute sinusitis 2.  Vertigo  He continues to feel improved through duration of ED visit. No neurologic deficits on exam. Plan to treat with Meclizine and with Zithromax z-pack for sinusitis, likely contributing to symptoms of dizziness. He has follow up available with Dr. Dorthy Cooler and is encouraged to be seen this week for recheck.     Charlann Lange, PA-C 12/27/14 1731  Charlesetta Shanks, MD 01/01/15 [phone]

## 2014-12-27 NOTE — ED Notes (Signed)
Patient given Sprite to drink. Will finish that and will ambulate to the restroom to give urine sample.

## 2014-12-27 NOTE — ED Notes (Signed)
Patient transported to CT 

## 2014-12-27 NOTE — ED Notes (Signed)
Pt stated he only feels dizzy when he gets up.He denies any chest pain or feeling short of breath. Pt was given a sprite to drink . Tolerating well. Lungs clear in all fields and abd soft and nontender.

## 2014-12-27 NOTE — ED Notes (Addendum)
Pt c/o N/V/dizziness starting at 0300 this morning. Diaphoresis noted when he woke up this morning. Denies fevers. Says the N/V/Dizziness is more upon sudden movement. No hx vertigo or cardiac issues. Did clean up a dead mouse in the attic this morning and is concerned for virus contracted from rodents. Also vacuumed up rat droppings last night and read this could "aerosolize the virus." Did have gloves on when handling dead mouse. No other c/c.

## 2015-05-22 DIAGNOSIS — M5126 Other intervertebral disc displacement, lumbar region: Secondary | ICD-10-CM

## 2015-05-22 HISTORY — DX: Other intervertebral disc displacement, lumbar region: M51.26

## 2015-11-03 ENCOUNTER — Other Ambulatory Visit: Payer: Self-pay | Admitting: Family Medicine

## 2015-11-03 ENCOUNTER — Ambulatory Visit
Admission: RE | Admit: 2015-11-03 | Discharge: 2015-11-03 | Disposition: A | Discharge status: Home / Self Care | Payer: BC Managed Care – PPO | Source: Ambulatory Visit | Attending: Family Medicine | Admitting: Family Medicine

## 2015-11-03 DIAGNOSIS — Z01818 Encounter for other preprocedural examination: Secondary | ICD-10-CM

## 2015-12-28 NOTE — H&P (Signed)
Zachary Alvarado is an 65 y.o. male.    Chief Complaint: left shoulder pain  HPI: Pt is a 65 y.o. male complaining of left shoulder pain for multiple years. Pain had continually increased since the beginning. X-rays in the clinic show end-stage arthritic changes of the left shoulder. Pt has tried various conservative treatments which have failed to alleviate their symptoms, including injections and therapy. Various options are discussed with the patient. Risks, benefits and expectations were discussed with the patient. Patient understand the risks, benefits and expectations and wishes to proceed with surgery.   PCP:  Lujean Amel, MD  D/C Plans: Home  PMH: Past Medical History:  Diagnosis Date  . Adenomatous colon polyp    Dr Watt Climes 2004  . Anxiety    social   . Chronic kidney disease   . Depression   . Hemorrhoid   . Hypercholesterolemia   . Kidney stones, calcium oxalate   . OSA on CPAP    withAHI 2/hr-titrated to 13cm H2O  . Wears glasses     PSH: Past Surgical History:  Procedure Laterality Date  . COLONOSCOPY    . ETHMOIDECTOMY N/A 04/03/2013   Procedure: ETHMOIDECTOMY;  Surgeon: Izora Gala, MD;  Location: [place];  Service: ENT;  Laterality: N/A;  . FINGER TENDON REPAIR  2008   and arteries-lt middle finger  . KIDNEY STONE SURGERY  1996   calcium oxalate kidney stone  . NASAL SINUS SURGERY  1996  . POLYPECTOMY Bilateral 04/03/2013   Procedure: BILATERAL NASAL POLYPECTOMY ;  Surgeon: Izora Gala, MD;  Location: [place];  Service: ENT;  Laterality: Bilateral;    Social History:  reports that he has never smoked. He does not have any smokeless tobacco history on file. He reports that he drinks alcohol. He reports that he does not use drugs.  Allergies:  Allergies  Allergen Reactions  . Penicillins     RASH but pt can take keflex without problems     Medications: No current facility-administered medications for this  encounter.    Current Outpatient Prescriptions  Medication Sig Dispense Refill  . aspirin 81 MG tablet Take 81 mg by mouth daily.    Marland Kitchen azelastine (ASTELIN) 137 MCG/SPRAY nasal spray Place 2 sprays into both nostrils 2 (two) times daily as needed for allergies. Use in each nostril as directed    . azithromycin (ZITHROMAX Z-PAK) 250 MG tablet Take 1 tablet (250 mg total) by mouth daily. 4 tablet 0  . carisoprodol (SOMA) 350 MG tablet Take 350 mg by mouth daily as needed for muscle spasms. Take as directed    . Cetirizine HCl (ZYRTEC ALLERGY) 10 MG CAPS Take 1 capsule by mouth daily as needed (allergies).     . colchicine 0.6 MG tablet Take 0.6 mg by mouth 2 (two) times daily. Gout flare up  0  . meclizine (ANTIVERT) 25 MG tablet Take 1 tablet (25 mg total) by mouth 3 (three) times daily as needed. 21 tablet 0  . meloxicam (MOBIC) 15 MG tablet Take 1 tablet by mouth daily as needed. Muscle spasms/pain  2  . NON FORMULARY CPAP Machine as directed    . ondansetron (ZOFRAN) 4 MG tablet Take 1 tablet (4 mg total) by mouth every 6 (six) hours. 12 tablet 0  . psyllium (METAMUCIL) 58.6 % powder Take 1 packet by mouth daily as needed (pt said he was taking it for cholestrol control). As neded for constipation      No  results found for this or any previous visit (from the past 64 hour(s)). No results found.  ROS: Pain with rom of the left upper extremity  Physical Exam:  Alert and oriented 65 y.o. male in no acute distress Cranial nerves 2-12 intact Cervical spine: full rom with no tenderness, nv intact distally Chest: active breath sounds bilaterally, no wheeze rhonchi or rales Heart: regular rate and rhythm, no murmur Abd: non tender non distended with active bowel sounds Hip is stable with rom  Left shoulder limited rom due to pain and guarding nv intact distally No rashes or edema Cuff intact with mild strength deficit  Assessment/Plan Assessment: left shoulder end stage  osteoarthritis  Plan: Patient will undergo a left total shoulder arthroplasty by Dr. [name] at [facility]. Risks benefits and expectations were discussed with the patient. Patient understand risks, benefits and expectations and wishes to proceed.

## 2016-01-06 NOTE — Pre-Procedure Instructions (Addendum)
INDIE MATKOVICH  01/06/2016      [id number] - [place], [place] - [address] [place] [address] Phone: [phone] Fax: [phone]  [facility] [place], [place] - [address] [place] [address] [place] [address] Phone: [phone] Fax: [phone]    Your procedure is scheduled on  Monday, August 28.   Report to [facility] Admitting at 9:45 A.M.             ( Your surgery or procedure is scheduled for 11:45am - 2:25 pm)   Call this number if you have problems the morning of surgery:[phone]                For any other questions, please call [phone], Monday - Friday 8 AM - 4 PM.   Remember:  Do not eat food or drink liquids after midnight Sunday, August 27.   Take these medicines the morning of surgery with A SIP OF WATER : Take if needed: colchicine.                 1 Week prior to surgery STOP taking Aspirin , Aspirin Products (Goody Powder, Excedrin Migraine), Ibuprofen (Advil), Naproxen (Aleve), Viatiams and Herbal Products (ie Fish Oil)                      [place]- Preparing For Surgery  Before surgery, you can play an important role. Because skin is not sterile, your skin needs to be as free of germs as possible. You can reduce the number of germs on your skin by washing with CHG (chlorahexidine gluconate) Soap before surgery.  CHG is an antiseptic cleaner which kills germs and bonds with the skin to continue killing germs even after washing.  Please do not use if you have an allergy to CHG or antibacterial soaps. If your skin becomes reddened/irritated stop using the CHG.  Do not shave (including legs and underarms) for at least 48 hours prior to first CHG shower. It is OK to shave your face.  Please follow these instructions carefully.   1. Shower the NIGHT BEFORE SURGERY and the MORNING OF SURGERY  with CHG.   2. If you chose to wash your hair, wash your hair first as usual with your normal shampoo.  3. After you shampoo, rinse your hair and body thoroughly to remove the shampoo.  4. Use CHG as you would any other liquid soap. You can apply CHG directly to the skin and wash gently with a scrungie or a clean washcloth.   5. Apply the CHG Soap to your body ONLY FROM THE NECK DOWN.  Do not use on open wounds or open sores. Avoid contact with your eyes, ears, mouth and genitals (private parts). Wash genitals (private parts) with your normal soap.  6. Wash thoroughly, paying special attention to the area where your surgery will be performed.  7. Thoroughly rinse your body with warm water from the neck down.  8. DO NOT shower/wash with your normal soap after using and rinsing off the CHG Soap.  9. Pat yourself dry with a CLEAN TOWEL.   10. Wear CLEAN PAJAMAS   11. Place CLEAN SHEETS on your bed the night of your first shower and DO NOT SLEEP WITH PETS.  Day of Surgery: Do  not apply any deodorants/lotions. Please wear clean clothes to the hospital/surgery center.                    Do not wear jewelry, make-up or nail polish.  Do not wear lotions, powders, or perfumes. DO NOT wear deoderant.   Men may shave face and neck.  Do not bring valuables to the hospital.  [facility] is not responsible for any belongings or valuables.  Contacts, dentures or bridgework may not be worn into surgery.  Leave your suitcase in the car.  After surgery it may be brought to your room.  For patients admitted to the hospital, discharge time will be determined by your treatment team.  Patients discharged the day of surgery will not be allowed to drive home.   Name and phone number of your driver: - Special instructions:  -  Please read over the following fact sheets that you were given.  Patient Instructions for Mupirocin Application, Incentive Spirometry.

## 2016-01-09 ENCOUNTER — Other Ambulatory Visit: Payer: Self-pay

## 2016-01-09 ENCOUNTER — Encounter (HOSPITAL_COMMUNITY): Payer: Self-pay

## 2016-01-09 ENCOUNTER — Encounter (HOSPITAL_COMMUNITY)
Admission: RE | Admit: 2016-01-09 | Discharge: 2016-01-09 | Disposition: A | Discharge status: Home / Self Care | Payer: Medicare Other | Source: Ambulatory Visit | Attending: Orthopedic Surgery | Admitting: Orthopedic Surgery

## 2016-01-09 DIAGNOSIS — Z01818 Encounter for other preprocedural examination: Secondary | ICD-10-CM | POA: Diagnosis not present

## 2016-01-09 DIAGNOSIS — Z01812 Encounter for preprocedural laboratory examination: Secondary | ICD-10-CM | POA: Diagnosis not present

## 2016-01-09 DIAGNOSIS — M19012 Primary osteoarthritis, left shoulder: Secondary | ICD-10-CM | POA: Diagnosis not present

## 2016-01-09 HISTORY — DX: Gout, unspecified: M10.9

## 2016-01-09 HISTORY — DX: Unspecified osteoarthritis, unspecified site: M19.90

## 2016-01-09 HISTORY — DX: Other seasonal allergic rhinitis: J30.2

## 2016-01-09 HISTORY — DX: Gastro-esophageal reflux disease without esophagitis: K21.9

## 2016-01-09 LAB — CBC
HCT: 45 % (ref 39.0–52.0)
Hemoglobin: 15 g/dL (ref 13.0–17.0)
MCH: 30 pg (ref 26.0–34.0)
MCHC: 33.3 g/dL (ref 30.0–36.0)
MCV: 90 fL (ref 78.0–100.0)
Platelets: 263 10*3/uL (ref 150–400)
RBC: 5 MIL/uL (ref 4.22–5.81)
RDW: 12.3 % (ref 11.5–15.5)
WBC: 7.7 10*3/uL (ref 4.0–10.5)

## 2016-01-09 LAB — BASIC METABOLIC PANEL
Anion gap: 7 (ref 5–15)
BUN: 11 mg/dL (ref 6–20)
CO2: 24 mmol/L (ref 22–32)
Calcium: 9.3 mg/dL (ref 8.9–10.3)
Chloride: 107 mmol/L (ref 101–111)
Creatinine, Ser: 1.25 mg/dL — ABNORMAL HIGH (ref 0.61–1.24)
GFR calc Af Amer: >60 mL/min (ref >60)
GFR calc non Af Amer: 59 mL/min — ABNORMAL LOW (ref >60)
Glucose, Bld: 108 mg/dL — ABNORMAL HIGH (ref 65–99)
Potassium: 4.1 mmol/L (ref 3.5–5.1)
Sodium: 138 mmol/L (ref 135–145)

## 2016-01-09 LAB — SURGICAL PCR SCREEN
MRSA, PCR: NEGATIVE
Staphylococcus aureus: NEGATIVE

## 2016-01-13 MED ORDER — CLINDAMYCIN PHOSPHATE 900 MG/50ML IV SOLN: 900.0000 mg | INTRAVENOUS | Status: AC

## 2016-02-06 DIAGNOSIS — M5416 Radiculopathy, lumbar region: Secondary | ICD-10-CM | POA: Diagnosis not present

## 2016-02-06 DIAGNOSIS — Z23 Encounter for immunization: Secondary | ICD-10-CM | POA: Diagnosis not present

## 2016-02-13 DIAGNOSIS — L821 Other seborrheic keratosis: Secondary | ICD-10-CM | POA: Diagnosis not present

## 2016-02-13 DIAGNOSIS — D1801 Hemangioma of skin and subcutaneous tissue: Secondary | ICD-10-CM | POA: Diagnosis not present

## 2016-02-13 DIAGNOSIS — L578 Other skin changes due to chronic exposure to nonionizing radiation: Secondary | ICD-10-CM | POA: Diagnosis not present

## 2016-02-13 DIAGNOSIS — Z23 Encounter for immunization: Secondary | ICD-10-CM | POA: Diagnosis not present

## 2016-02-13 DIAGNOSIS — L814 Other melanin hyperpigmentation: Secondary | ICD-10-CM | POA: Diagnosis not present

## 2016-02-13 DIAGNOSIS — D225 Melanocytic nevi of trunk: Secondary | ICD-10-CM | POA: Diagnosis not present

## 2016-02-13 DIAGNOSIS — Z85828 Personal history of other malignant neoplasm of skin: Secondary | ICD-10-CM | POA: Diagnosis not present

## 2016-02-13 DIAGNOSIS — M545 Low back pain: Secondary | ICD-10-CM | POA: Diagnosis not present

## 2016-02-13 DIAGNOSIS — M5416 Radiculopathy, lumbar region: Secondary | ICD-10-CM | POA: Diagnosis not present

## 2016-02-16 NOTE — H&P (Signed)
Zachary Alvarado is an 65 y.o. male.    Chief Complaint: left shoulder pain  HPI: Pt is a 65 y.o. male complaining of left shoulder pain for multiple years. Pain had continually increased since the beginning. X-rays in the clinic show end-stage arthritic changes of the left shoulder. Pt has tried various conservative treatments which have failed to alleviate their symptoms, including injections and therapy. Various options are discussed with the patient. Risks, benefits and expectations were discussed with the patient. Patient understand the risks, benefits and expectations and wishes to proceed with surgery.   PCP:  Zachary Amel, MD  D/C Plans: Home  PMH: Past Medical History:  Diagnosis Date  . Adenomatous colon polyp    Dr Watt Climes 2004  . Anxiety    social   . Arthritis   . Chronic kidney disease   . Depression    at one point in his life  . GERD (gastroesophageal reflux disease)    occasionally, take OTC meds x 14 days  . Gout of big toe    in right foot  . Hemorrhoid   . Hypercholesterolemia   . Kidney stones, calcium oxalate   . OSA on CPAP    withAHI 2/hr-titrated to 13cm H2O  . OSA on CPAP    tested more than 5-6 yrs ago ([place] of [place])  . Seasonal allergies   . Wears glasses     PSH: Past Surgical History:  Procedure Laterality Date  . COLONOSCOPY    . ETHMOIDECTOMY N/A 04/03/2013   Procedure: ETHMOIDECTOMY;  Surgeon: Zachary Gala, MD;  Location: [place];  Service: ENT;  Laterality: N/A;  . FINGER TENDON REPAIR  2008   and arteries-lt middle finger  . KIDNEY STONE SURGERY  1996   calcium oxalate kidney stone  . NASAL SINUS SURGERY  1996  . POLYPECTOMY Bilateral 04/03/2013   Procedure: BILATERAL NASAL POLYPECTOMY ;  Surgeon: Zachary Gala, MD;  Location: [place];  Service: ENT;  Laterality: Bilateral;    Social History:  reports that he has never smoked. He has never used smokeless tobacco. He reports that he  drinks alcohol. He reports that he does not use drugs.  Allergies:  Allergies  Allergen Reactions  . Wasp Venom Swelling    Anything that stings will cause him to swell  . Penicillins Rash    PATIENT ABLE TO TAKE KEFLEX W/O REACTION    Medications: No current facility-administered medications for this encounter.    Current Outpatient Prescriptions  Medication Sig Dispense Refill  . aspirin 81 MG tablet Take 81 mg by mouth daily.    Marland Kitchen azelastine (ASTELIN) 137 MCG/SPRAY nasal spray Place 2 sprays into both nostrils 2 (two) times daily as needed for allergies. Use in each nostril as directed    . CALCIUM-VITAMIN D PO Take 1 tablet by mouth daily.    . carisoprodol (SOMA) 350 MG tablet Take 350 mg by mouth daily as needed for muscle spasms. Take as directed    . colchicine 0.6 MG tablet Take 0.6 mg by mouth 2 (two) times daily as needed (gout flare up). Gout flare up  0  . diclofenac sodium (VOLTAREN) 1 % GEL Apply 1 application topically 3 (three) times daily.  0  . glucosamine-chondroitin 500-400 MG tablet Take 1 tablet by mouth daily.    . NON FORMULARY CPAP Machine as directed    . psyllium (METAMUCIL) 58.6 % powder Take 1 packet by mouth daily as needed (pt  said he was taking it for cholestrol control). As neded for constipation    . triamcinolone cream (KENALOG) 0.1 % Apply 1 application topically 2 (two) times daily as needed (rash -- in addition to lotion).    . triamcinolone lotion (KENALOG) 0.1 % Apply 1 application topically 3 (three) times daily.    Marland Kitchen azithromycin (ZITHROMAX Z-PAK) 250 MG tablet Take 1 tablet (250 mg total) by mouth daily. (Patient not taking: Reported on 01/04/2016) 4 tablet 0  . meclizine (ANTIVERT) 25 MG tablet Take 1 tablet (25 mg total) by mouth 3 (three) times daily as needed. (Patient not taking: Reported on 01/06/2016) 21 tablet 0  . ondansetron (ZOFRAN) 4 MG tablet Take 1 tablet (4 mg total) by mouth every 6 (six) hours. (Patient not taking: Reported on  01/06/2016) 12 tablet 0    No results found for this or any previous visit (from the past 48 hour(s)). No results found.  ROS: Pain with rom of the left upper extremity Recent lumbar back pain  Physical Exam:  Alert and oriented 65 y.o. male in no acute distress Cranial nerves 2-12 intact Cervical spine: full rom with no tenderness, nv intact distally Chest: active breath sounds bilaterally, no wheeze rhonchi or rales Heart: regular rate and rhythm, no murmur Abd: non tender non distended with active bowel sounds Hip is stable with rom  Left shoulder crepitus with rom Strength of ER and IR 5/5 bilaterally No rashes or edema  Assessment/Plan Assessment: left shoulder end stage osteoarthritis  Plan: Patient will undergo a left total shoulder by Dr. Veverly Alvarado at [facility]. Risks benefits and expectations were discussed with the patient. Patient understand risks, benefits and expectations and wishes to proceed.

## 2016-02-17 DIAGNOSIS — M5117 Intervertebral disc disorders with radiculopathy, lumbosacral region: Secondary | ICD-10-CM | POA: Diagnosis not present

## 2016-02-17 DIAGNOSIS — M5136 Other intervertebral disc degeneration, lumbar region: Secondary | ICD-10-CM | POA: Diagnosis not present

## 2016-03-01 ENCOUNTER — Encounter (HOSPITAL_COMMUNITY)
Admission: RE | Admit: 2016-03-01 | Discharge: 2016-03-01 | Disposition: A | Discharge status: Home / Self Care | Payer: Medicare Other | Source: Ambulatory Visit | Attending: Orthopedic Surgery | Admitting: Orthopedic Surgery

## 2016-03-01 ENCOUNTER — Encounter (HOSPITAL_COMMUNITY): Payer: Self-pay

## 2016-03-01 DIAGNOSIS — Z7982 Long term (current) use of aspirin: Secondary | ICD-10-CM | POA: Insufficient documentation

## 2016-03-01 DIAGNOSIS — Z880 Allergy status to penicillin: Secondary | ICD-10-CM | POA: Diagnosis not present

## 2016-03-01 DIAGNOSIS — G4733 Obstructive sleep apnea (adult) (pediatric): Secondary | ICD-10-CM | POA: Insufficient documentation

## 2016-03-01 DIAGNOSIS — M19012 Primary osteoarthritis, left shoulder: Secondary | ICD-10-CM | POA: Diagnosis not present

## 2016-03-01 DIAGNOSIS — Z79899 Other long term (current) drug therapy: Secondary | ICD-10-CM | POA: Diagnosis not present

## 2016-03-01 DIAGNOSIS — Z01812 Encounter for preprocedural laboratory examination: Secondary | ICD-10-CM | POA: Diagnosis not present

## 2016-03-01 DIAGNOSIS — M109 Gout, unspecified: Secondary | ICD-10-CM | POA: Insufficient documentation

## 2016-03-01 HISTORY — DX: Other intervertebral disc displacement, lumbar region: M51.26

## 2016-03-01 LAB — BASIC METABOLIC PANEL
Anion gap: 8 (ref 5–15)
BUN: 8 mg/dL (ref 6–20)
CO2: 26 mmol/L (ref 22–32)
Calcium: 9.6 mg/dL (ref 8.9–10.3)
Chloride: 104 mmol/L (ref 101–111)
Creatinine, Ser: 1.17 mg/dL (ref 0.61–1.24)
GFR calc Af Amer: >60 mL/min (ref >60)
GFR calc non Af Amer: >60 mL/min (ref >60)
Glucose, Bld: 96 mg/dL (ref 65–99)
Potassium: 4.1 mmol/L (ref 3.5–5.1)
Sodium: 138 mmol/L (ref 135–145)

## 2016-03-01 LAB — CBC
HCT: 44.8 % (ref 39.0–52.0)
Hemoglobin: 15.7 g/dL (ref 13.0–17.0)
MCH: 30.7 pg (ref 26.0–34.0)
MCHC: 35 g/dL (ref 30.0–36.0)
MCV: 87.5 fL (ref 78.0–100.0)
Platelets: 319 10*3/uL (ref 150–400)
RBC: 5.12 MIL/uL (ref 4.22–5.81)
RDW: 11.8 % (ref 11.5–15.5)
WBC: 7.8 10*3/uL (ref 4.0–10.5)

## 2016-03-01 LAB — SURGICAL PCR SCREEN
MRSA, PCR: NEGATIVE
Staphylococcus aureus: NEGATIVE

## 2016-03-01 NOTE — Pre-Procedure Instructions (Signed)
Zachary Alvarado  03/01/2016      [id number] - [place], [place] - [address] [place] [address] Phone: [phone] Fax: [phone]  [facility] [place] [place] - [address] [place] [address] [place] [address] [place] [address] Phone: [phone] Fax: [phone]    Your procedure is scheduled on 03/09/2016  Report to [facility] Admitting at 11:00 A.M.  Call this number if you have problems the morning of surgery:  [phone]   Remember:  Do not eat food or drink liquids after midnight.  On THURSDAY  Take these medicines the morning of surgery with A SIP OF WATER : colchicine is ok if needed    Do not wear jewelry   Do not wear lotions, powders, or perfumes, or deoderant.     Men may shave face and neck.   Do not bring valuables to the hospital.   [facility] is not responsible for any belongings or valuables.  Contacts, dentures or bridgework may not be worn into surgery.  Leave your suitcase in the car.  After surgery it may be brought to your room.  For patients admitted to the hospital, discharge time will be determined by your treatment team.  Patients discharged the day of surgery will not be allowed to drive home.   Name and phone number of your driver:  With wife   Special instructions: Special Instructions: [name] - Preparing for Surgery  Before surgery, you can play an important role.  Because skin is not sterile, your skin needs to be as free of germs as possible.  You can reduce the number of germs on you skin by washing with CHG (chlorahexidine gluconate) soap before surgery.  CHG is an antiseptic cleaner which kills germs and bonds with the skin to continue killing germs even after washing.  Please DO NOT use if you have an allergy to CHG or antibacterial soaps.  If your skin becomes reddened/irritated stop  using the CHG and inform your nurse when you arrive at Short Stay.  Do not shave (including legs and underarms) for at least 48 hours prior to the first CHG shower.  You may shave your face.  Please follow these instructions carefully:   1.  Shower with CHG Soap the night before surgery and the  morning of Surgery.  2.  If you choose to wash your hair, wash your hair first as usual with your  normal shampoo.  3.  After you shampoo, rinse your hair and body thoroughly to remove the  Shampoo.  4.  Use CHG as you would any other liquid soap.  You can apply chg directly to the skin and wash gently with scrungie or a clean washcloth.  5.  Apply the CHG Soap to your body ONLY FROM THE NECK DOWN.    Do not use on open wounds or open sores.  Avoid contact with your eyes, ears, mouth and genitals (private parts).  Wash genitals (private parts)   with your normal soap.  6.  Wash thoroughly, paying special attention to the area where your surgery will be performed.  7.  Thoroughly rinse your body with warm water from the neck down.  8.  DO NOT shower/wash with your normal soap after using and rinsing off   the CHG Soap.  9.  [name]  yourself dry with a clean towel.            10.  Wear clean pajamas.            11.  Place clean sheets on your bed the night of your first shower and do not sleep with pets.  Day of Surgery  Do not apply any lotions/deodorants the morning of surgery.  Please wear clean clothes to the hospital/surgery center.     Please read over the following fact sheets that you were given. Pain Booklet, MRSA Information and Surgical Site Infection Prevention

## 2016-03-05 DIAGNOSIS — M5416 Radiculopathy, lumbar region: Secondary | ICD-10-CM | POA: Diagnosis not present

## 2016-03-08 DIAGNOSIS — M5416 Radiculopathy, lumbar region: Secondary | ICD-10-CM | POA: Diagnosis not present

## 2016-03-09 ENCOUNTER — Inpatient Hospital Stay (HOSPITAL_COMMUNITY)
Admission: RE | Admit: 2016-03-09 | Discharge: 2016-03-11 | DRG: 483 | Disposition: A | Discharge status: Home / Self Care | Payer: Medicare Other | Source: Ambulatory Visit | Attending: Orthopedic Surgery | Admitting: Orthopedic Surgery

## 2016-03-09 ENCOUNTER — Encounter (HOSPITAL_COMMUNITY)
Admission: RE | Disposition: A | Discharge status: Home / Self Care | Payer: Self-pay | Source: Ambulatory Visit | Attending: Orthopedic Surgery

## 2016-03-09 ENCOUNTER — Inpatient Hospital Stay (HOSPITAL_COMMUNITY): Payer: Medicare Other | Admitting: Certified Registered Nurse Anesthetist

## 2016-03-09 ENCOUNTER — Inpatient Hospital Stay (HOSPITAL_COMMUNITY): Payer: Medicare Other

## 2016-03-09 ENCOUNTER — Encounter (HOSPITAL_COMMUNITY): Payer: Self-pay | Admitting: *Deleted

## 2016-03-09 DIAGNOSIS — Z791 Long term (current) use of non-steroidal anti-inflammatories (NSAID): Secondary | ICD-10-CM | POA: Diagnosis not present

## 2016-03-09 DIAGNOSIS — J302 Other seasonal allergic rhinitis: Secondary | ICD-10-CM | POA: Diagnosis not present

## 2016-03-09 DIAGNOSIS — G4733 Obstructive sleep apnea (adult) (pediatric): Secondary | ICD-10-CM | POA: Diagnosis present

## 2016-03-09 DIAGNOSIS — Z973 Presence of spectacles and contact lenses: Secondary | ICD-10-CM

## 2016-03-09 DIAGNOSIS — N189 Chronic kidney disease, unspecified: Secondary | ICD-10-CM | POA: Diagnosis not present

## 2016-03-09 DIAGNOSIS — Z79899 Other long term (current) drug therapy: Secondary | ICD-10-CM | POA: Diagnosis not present

## 2016-03-09 DIAGNOSIS — G8918 Other acute postprocedural pain: Secondary | ICD-10-CM | POA: Diagnosis not present

## 2016-03-09 DIAGNOSIS — Z96612 Presence of left artificial shoulder joint: Secondary | ICD-10-CM | POA: Diagnosis not present

## 2016-03-09 DIAGNOSIS — Z91030 Bee allergy status: Secondary | ICD-10-CM

## 2016-03-09 DIAGNOSIS — Z7982 Long term (current) use of aspirin: Secondary | ICD-10-CM

## 2016-03-09 DIAGNOSIS — Z880 Allergy status to penicillin: Secondary | ICD-10-CM

## 2016-03-09 DIAGNOSIS — M19012 Primary osteoarthritis, left shoulder: Principal | ICD-10-CM | POA: Diagnosis present

## 2016-03-09 DIAGNOSIS — Z9989 Dependence on other enabling machines and devices: Secondary | ICD-10-CM

## 2016-03-09 DIAGNOSIS — M25512 Pain in left shoulder: Secondary | ICD-10-CM | POA: Diagnosis not present

## 2016-03-09 DIAGNOSIS — M109 Gout, unspecified: Secondary | ICD-10-CM | POA: Diagnosis present

## 2016-03-09 DIAGNOSIS — Z471 Aftercare following joint replacement surgery: Secondary | ICD-10-CM | POA: Diagnosis not present

## 2016-03-09 HISTORY — PX: TOTAL SHOULDER ARTHROPLASTY: SHX126

## 2016-03-09 SURGERY — ARTHROPLASTY, SHOULDER, TOTAL
Anesthesia: Regional | Site: Shoulder | Laterality: Left

## 2016-03-09 MED ORDER — AZELASTINE HCL 0.1 % NA SOLN: 2.0000 | Freq: Two times a day (BID) | NASAL | Status: DC | PRN

## 2016-03-09 MED ORDER — CLINDAMYCIN PHOSPHATE 600 MG/50ML IV SOLN
600.0000 mg | Freq: Four times a day (QID) | INTRAVENOUS | Status: AC
  Administered 2016-03-09 – 2016-03-10 (×3): 600 mg via INTRAVENOUS
  Filled 2016-03-09 (×3): qty 50

## 2016-03-09 MED ORDER — PNEUMOCOCCAL VAC POLYVALENT 25 MCG/0.5ML IJ INJ
0.5000 mL | INJECTION | INTRAMUSCULAR | Status: DC
  Filled 2016-03-09: qty 0.5

## 2016-03-09 MED ORDER — ROPIVACAINE HCL 7.5 MG/ML IJ SOLN
INTRAMUSCULAR | Status: DC | PRN
  Administered 2016-03-09: 20 mL via PERINEURAL

## 2016-03-09 MED ORDER — TRAMADOL HCL 50 MG PO TABS: 50.0000 mg | ORAL_TABLET | Freq: Four times a day (QID) | ORAL | Status: DC | PRN

## 2016-03-09 MED ORDER — SODIUM CHLORIDE 0.9 % IV SOLN
INTRAVENOUS | Status: DC
  Administered 2016-03-09: 17:00:00 via INTRAVENOUS

## 2016-03-09 MED ORDER — CLINDAMYCIN PHOSPHATE 900 MG/50ML IV SOLN
INTRAVENOUS | Status: AC
  Filled 2016-03-09: qty 50

## 2016-03-09 MED ORDER — ASPIRIN 81 MG PO CHEW
81.0000 mg | CHEWABLE_TABLET | Freq: Every day | ORAL | Status: DC
  Administered 2016-03-10 – 2016-03-11 (×2): 81 mg via ORAL
  Filled 2016-03-09 (×2): qty 1

## 2016-03-09 MED ORDER — METOCLOPRAMIDE HCL 5 MG PO TABS: 5.0000 mg | ORAL_TABLET | Freq: Three times a day (TID) | ORAL | Status: DC | PRN

## 2016-03-09 MED ORDER — BUPIVACAINE-EPINEPHRINE (PF) 0.5% -1:200000 IJ SOLN
INTRAMUSCULAR | Status: DC | PRN
  Administered 2016-03-09: 10 mL

## 2016-03-09 MED ORDER — HEMOSTATIC AGENTS (NO CHARGE) OPTIME
TOPICAL | Status: DC | PRN
  Administered 2016-03-09: 1 via TOPICAL

## 2016-03-09 MED ORDER — HYDROMORPHONE HCL 2 MG/ML IJ SOLN
1.0000 mg | INTRAMUSCULAR | Status: DC | PRN
  Administered 2016-03-10 (×2): 1 mg via INTRAVENOUS
  Filled 2016-03-09 (×2): qty 1

## 2016-03-09 MED ORDER — MENTHOL 3 MG MT LOZG
1.0000 | LOZENGE | OROMUCOSAL | Status: DC | PRN
Start: 2016-03-09 — End: 2016-03-11

## 2016-03-09 MED ORDER — ACETAMINOPHEN 325 MG PO TABS
650.0000 mg | ORAL_TABLET | Freq: Four times a day (QID) | ORAL | Status: DC | PRN
Start: 2016-03-09 — End: 2016-03-11

## 2016-03-09 MED ORDER — HYDROCODONE-ACETAMINOPHEN 5-325 MG PO TABS
1.0000 | ORAL_TABLET | ORAL | Status: DC | PRN
  Administered 2016-03-10 – 2016-03-11 (×7): 2 via ORAL
  Filled 2016-03-09 (×7): qty 2

## 2016-03-09 MED ORDER — FENTANYL CITRATE (PF) 100 MCG/2ML IJ SOLN
INTRAMUSCULAR | Status: AC
  Administered 2016-03-09: 50 ug
  Filled 2016-03-09: qty 2

## 2016-03-09 MED ORDER — ONDANSETRON HCL 4 MG/2ML IJ SOLN
INTRAMUSCULAR | Status: AC
  Filled 2016-03-09: qty 2

## 2016-03-09 MED ORDER — 0.9 % SODIUM CHLORIDE (POUR BTL) OPTIME
TOPICAL | Status: DC | PRN
  Administered 2016-03-09: 1000 mL

## 2016-03-09 MED ORDER — ONDANSETRON HCL 4 MG/2ML IJ SOLN
INTRAMUSCULAR | Status: DC | PRN
  Administered 2016-03-09: 4 mg via INTRAVENOUS

## 2016-03-09 MED ORDER — ONDANSETRON HCL 4 MG/2ML IJ SOLN
4.0000 mg | Freq: Four times a day (QID) | INTRAMUSCULAR | Status: DC | PRN
  Administered 2016-03-10: 4 mg via INTRAVENOUS
  Filled 2016-03-09: qty 2

## 2016-03-09 MED ORDER — DICLOFENAC SODIUM 1 % TD GEL
1.0000 "application " | Freq: Three times a day (TID) | TRANSDERMAL | Status: DC
  Administered 2016-03-10: 1 via TOPICAL
  Filled 2016-03-09: qty 100

## 2016-03-09 MED ORDER — BISACODYL 10 MG RE SUPP: 10.0000 mg | Freq: Every day | RECTAL | Status: DC | PRN

## 2016-03-09 MED ORDER — METHOCARBAMOL 1000 MG/10ML IJ SOLN
500.0000 mg | Freq: Four times a day (QID) | INTRAVENOUS | Status: DC | PRN
  Filled 2016-03-09: qty 5

## 2016-03-09 MED ORDER — TRIAMCINOLONE ACETONIDE 0.1 % EX CREA
TOPICAL_CREAM | Freq: Three times a day (TID) | CUTANEOUS | Status: DC
  Filled 2016-03-09: qty 15

## 2016-03-09 MED ORDER — PHENYLEPHRINE 40 MCG/ML (10ML) SYRINGE FOR IV PUSH (FOR BLOOD PRESSURE SUPPORT)
PREFILLED_SYRINGE | INTRAVENOUS | Status: AC
  Filled 2016-03-09: qty 10

## 2016-03-09 MED ORDER — PHENYLEPHRINE HCL 10 MG/ML IJ SOLN
INTRAMUSCULAR | Status: DC | PRN
  Administered 2016-03-09: 160 ug via INTRAVENOUS

## 2016-03-09 MED ORDER — TRIAMCINOLONE ACETONIDE 0.1 % EX LOTN
1.0000 "application " | TOPICAL_LOTION | Freq: Three times a day (TID) | CUTANEOUS | Status: DC
  Filled 2016-03-09: qty 60

## 2016-03-09 MED ORDER — METHOCARBAMOL 500 MG PO TABS
500.0000 mg | ORAL_TABLET | Freq: Three times a day (TID) | ORAL | Status: DC
  Administered 2016-03-09 – 2016-03-11 (×6): 500 mg via ORAL
  Filled 2016-03-09 (×7): qty 1

## 2016-03-09 MED ORDER — ACETAMINOPHEN 650 MG RE SUPP: 650.0000 mg | Freq: Four times a day (QID) | RECTAL | Status: DC | PRN

## 2016-03-09 MED ORDER — PHENYLEPHRINE HCL 10 MG/ML IJ SOLN
INTRAVENOUS | Status: DC | PRN
  Administered 2016-03-09: 30 ug/min via INTRAVENOUS

## 2016-03-09 MED ORDER — ONDANSETRON HCL 4 MG/2ML IJ SOLN: 4.0000 mg | Freq: Once | INTRAMUSCULAR | Status: DC | PRN

## 2016-03-09 MED ORDER — ONDANSETRON HCL 4 MG PO TABS: 4.0000 mg | ORAL_TABLET | Freq: Four times a day (QID) | ORAL | Status: DC | PRN

## 2016-03-09 MED ORDER — FENTANYL CITRATE (PF) 100 MCG/2ML IJ SOLN
INTRAMUSCULAR | Status: AC
  Filled 2016-03-09: qty 2

## 2016-03-09 MED ORDER — PHENOL 1.4 % MT LIQD: 1.0000 | OROMUCOSAL | Status: DC | PRN

## 2016-03-09 MED ORDER — LIDOCAINE 2% (20 MG/ML) 5 ML SYRINGE
INTRAMUSCULAR | Status: AC
  Filled 2016-03-09: qty 5

## 2016-03-09 MED ORDER — CLINDAMYCIN PHOSPHATE 900 MG/50ML IV SOLN
900.0000 mg | INTRAVENOUS | Status: AC
  Administered 2016-03-09: 900 mg via INTRAVENOUS

## 2016-03-09 MED ORDER — PROPOFOL 10 MG/ML IV BOLUS
INTRAVENOUS | Status: DC | PRN
  Administered 2016-03-09: 150 mg via INTRAVENOUS

## 2016-03-09 MED ORDER — LIDOCAINE HCL (CARDIAC) 20 MG/ML IV SOLN
INTRAVENOUS | Status: DC | PRN
  Administered 2016-03-09: 100 mg via INTRAVENOUS

## 2016-03-09 MED ORDER — ONDANSETRON HCL 4 MG/2ML IJ SOLN: INTRAMUSCULAR | Status: DC | PRN

## 2016-03-09 MED ORDER — METHOCARBAMOL 500 MG PO TABS: 500.0000 mg | ORAL_TABLET | Freq: Three times a day (TID) | ORAL | 1 refills | Status: DC | PRN

## 2016-03-09 MED ORDER — LACTATED RINGERS IV SOLN
INTRAVENOUS | Status: DC
  Administered 2016-03-09 (×2): via INTRAVENOUS

## 2016-03-09 MED ORDER — INFLUENZA VAC SPLIT QUAD 0.5 ML IM SUSY
0.5000 mL | PREFILLED_SYRINGE | INTRAMUSCULAR | Status: DC
  Filled 2016-03-09: qty 0.5

## 2016-03-09 MED ORDER — CHLORHEXIDINE GLUCONATE 4 % EX LIQD: 60.0000 mL | Freq: Once | CUTANEOUS | Status: DC

## 2016-03-09 MED ORDER — SUCCINYLCHOLINE CHLORIDE 200 MG/10ML IV SOSY
PREFILLED_SYRINGE | INTRAVENOUS | Status: AC
  Filled 2016-03-09: qty 10

## 2016-03-09 MED ORDER — POLYETHYLENE GLYCOL 3350 17 G PO PACK: 17.0000 g | PACK | Freq: Every day | ORAL | Status: DC | PRN

## 2016-03-09 MED ORDER — THROMBIN 5000 UNITS EX SOLR
CUTANEOUS | Status: AC
  Filled 2016-03-09: qty 5000

## 2016-03-09 MED ORDER — GLUCOSAMINE-CHONDROITIN 500-400 MG PO TABS: 1.0000 | ORAL_TABLET | Freq: Every day | ORAL | Status: DC

## 2016-03-09 MED ORDER — HYDROCODONE-ACETAMINOPHEN 5-325 MG PO TABS: 1.0000 | ORAL_TABLET | Freq: Four times a day (QID) | ORAL | 0 refills | Status: DC | PRN

## 2016-03-09 MED ORDER — CALCIUM CARBONATE-VITAMIN D 500-200 MG-UNIT PO TABS
ORAL_TABLET | Freq: Every day | ORAL | Status: DC
  Administered 2016-03-10 – 2016-03-11 (×2): 1 via ORAL
  Filled 2016-03-09 (×2): qty 1

## 2016-03-09 MED ORDER — TRIAMCINOLONE ACETONIDE 0.1 % EX CREA: 1.0000 "application " | TOPICAL_CREAM | Freq: Two times a day (BID) | CUTANEOUS | Status: DC | PRN

## 2016-03-09 MED ORDER — PROPOFOL 10 MG/ML IV BOLUS
INTRAVENOUS | Status: AC
  Filled 2016-03-09: qty 20

## 2016-03-09 MED ORDER — FENTANYL CITRATE (PF) 100 MCG/2ML IJ SOLN: 25.0000 ug | INTRAMUSCULAR | Status: DC | PRN

## 2016-03-09 MED ORDER — METOCLOPRAMIDE HCL 5 MG/ML IJ SOLN
5.0000 mg | Freq: Three times a day (TID) | INTRAMUSCULAR | Status: DC | PRN
Start: 2016-03-09 — End: 2016-03-11

## 2016-03-09 MED ORDER — METHOCARBAMOL 500 MG PO TABS
500.0000 mg | ORAL_TABLET | Freq: Four times a day (QID) | ORAL | Status: DC | PRN
  Administered 2016-03-10: 500 mg via ORAL

## 2016-03-09 MED ORDER — MIDAZOLAM HCL 2 MG/2ML IJ SOLN
INTRAMUSCULAR | Status: AC
  Administered 2016-03-09: 1 mg
  Filled 2016-03-09: qty 2

## 2016-03-09 MED ORDER — SUCCINYLCHOLINE CHLORIDE 20 MG/ML IJ SOLN
INTRAMUSCULAR | Status: DC | PRN
  Administered 2016-03-09: 100 mg via INTRAVENOUS

## 2016-03-09 MED ORDER — THROMBIN 5000 UNITS EX SOLR
CUTANEOUS | Status: DC | PRN
  Administered 2016-03-09: 5000 [IU] via TOPICAL

## 2016-03-09 MED ORDER — COLCHICINE 0.6 MG PO TABS: 0.6000 mg | ORAL_TABLET | Freq: Two times a day (BID) | ORAL | Status: DC | PRN

## 2016-03-09 MED ORDER — FENTANYL CITRATE (PF) 100 MCG/2ML IJ SOLN
INTRAMUSCULAR | Status: DC | PRN
  Administered 2016-03-09: 100 ug via INTRAVENOUS

## 2016-03-09 SURGICAL SUPPLY — 74 items
BLADE SAW SAG 73X25 THK (BLADE) ×2
BLADE SAW SGTL 73X25 THK (BLADE) ×1 IMPLANT
BUR SURG 4X8 MED (BURR) IMPLANT
BURR SURG 4MMX8MM MEDIUM (BURR)
BURR SURG 4X8 MED (BURR)
CAPT SHLDR TOTAL 2 ×3 IMPLANT
CEMENT BONE DEPUY (Cement) ×3 IMPLANT
CLOSURE WOUND 1/2 X4 (GAUZE/BANDAGES/DRESSINGS) ×1
COVER SURGICAL LIGHT HANDLE (MISCELLANEOUS) ×3 IMPLANT
DRAPE IMP U-DRAPE 54X76 (DRAPES) ×3 IMPLANT
DRAPE INCISE IOBAN 66X45 STRL (DRAPES) ×12 IMPLANT
DRAPE U-SHAPE 47X51 STRL (DRAPES) ×3 IMPLANT
DRAPE X-RAY CASS 24X20 (DRAPES) IMPLANT
DRILL BIT 5/64 (BIT) ×3 IMPLANT
DRSG ADAPTIC 3X8 NADH LF (GAUZE/BANDAGES/DRESSINGS) ×3 IMPLANT
DRSG PAD ABDOMINAL 8X10 ST (GAUZE/BANDAGES/DRESSINGS) ×3 IMPLANT
DURAPREP 26ML APPLICATOR (WOUND CARE) ×3 IMPLANT
ELECT BLADE 4.0 EZ CLEAN MEGAD (MISCELLANEOUS) ×3
ELECT NEEDLE TIP 2.8 STRL (NEEDLE) ×3 IMPLANT
ELECT REM PT RETURN 9FT ADLT (ELECTROSURGICAL) ×3
ELECTRODE BLDE 4.0 EZ CLN MEGD (MISCELLANEOUS) ×1 IMPLANT
ELECTRODE REM PT RTRN 9FT ADLT (ELECTROSURGICAL) ×1 IMPLANT
GAUZE SPONGE 4X4 12PLY STRL (GAUZE/BANDAGES/DRESSINGS) ×3 IMPLANT
GAUZE SPONGE 4X4 16PLY XRAY LF (GAUZE/BANDAGES/DRESSINGS) ×3 IMPLANT
GLOVE BIOGEL PI ORTHO PRO 7.5 (GLOVE) ×2
GLOVE BIOGEL PI ORTHO PRO SZ7 (GLOVE) ×2
GLOVE BIOGEL PI ORTHO PRO SZ8 (GLOVE) ×2
GLOVE ORTHO TXT STRL SZ7.5 (GLOVE) ×3 IMPLANT
GLOVE PI ORTHO PRO STRL 7.5 (GLOVE) ×1 IMPLANT
GLOVE PI ORTHO PRO STRL SZ7 (GLOVE) ×1 IMPLANT
GLOVE PI ORTHO PRO STRL SZ8 (GLOVE) ×1 IMPLANT
GLOVE SURG ORTHO 8.5 STRL (GLOVE) ×6 IMPLANT
GOWN STRL REUS W/ TWL XL LVL3 (GOWN DISPOSABLE) ×3 IMPLANT
GOWN STRL REUS W/TWL XL LVL3 (GOWN DISPOSABLE) ×6
HANDPIECE INTERPULSE COAX TIP (DISPOSABLE)
KIT BASIN OR (CUSTOM PROCEDURE TRAY) ×3 IMPLANT
KIT ROOM TURNOVER OR (KITS) ×3 IMPLANT
MANIFOLD NEPTUNE II (INSTRUMENTS) ×3 IMPLANT
NDL SUT 6 .5 CRC .975X.05 MAYO (NEEDLE) ×1 IMPLANT
NEEDLE 1/2 CIR MAYO (NEEDLE) ×3 IMPLANT
NEEDLE HYPO 25GX1X1/2 BEV (NEEDLE) ×3 IMPLANT
NEEDLE MAYO TAPER (NEEDLE) ×2
NS IRRIG 1000ML POUR BTL (IV SOLUTION) ×3 IMPLANT
PACK SHOULDER (CUSTOM PROCEDURE TRAY) ×3 IMPLANT
PACK UNIVERSAL I (CUSTOM PROCEDURE TRAY) ×3 IMPLANT
PAD ARMBOARD 7.5X6 YLW CONV (MISCELLANEOUS) ×6 IMPLANT
PIN METAGLENE 2.5 (PIN) ×3 IMPLANT
SET HNDPC FAN SPRY TIP SCT (DISPOSABLE) IMPLANT
SLING ARM IMMOBILIZER LRG (SOFTGOODS) ×3 IMPLANT
SLING ARM IMMOBILIZER MED (SOFTGOODS) IMPLANT
SMARTMIX MINI TOWER (MISCELLANEOUS) ×3
SPONGE LAP 18X18 X RAY DECT (DISPOSABLE) ×3 IMPLANT
SPONGE LAP 4X18 X RAY DECT (DISPOSABLE) ×3 IMPLANT
SPONGE SURGIFOAM ABS GEL SZ50 (HEMOSTASIS) IMPLANT
STRIP CLOSURE SKIN 1/2X4 (GAUZE/BANDAGES/DRESSINGS) ×2 IMPLANT
SUCTION FRAZIER HANDLE 10FR (MISCELLANEOUS) ×2
SUCTION TUBE FRAZIER 10FR DISP (MISCELLANEOUS) ×1 IMPLANT
SUT BONE WAX W31G (SUTURE) ×3 IMPLANT
SUT FIBERWIRE #2 38 T-5 BLUE (SUTURE) ×6
SUT MNCRL AB 4-0 PS2 18 (SUTURE) ×3 IMPLANT
SUT VIC AB 0 CT1 27 (SUTURE) ×2
SUT VIC AB 0 CT1 27XBRD ANBCTR (SUTURE) ×1 IMPLANT
SUT VIC AB 2-0 CT1 27 (SUTURE) ×2
SUT VIC AB 2-0 CT1 TAPERPNT 27 (SUTURE) ×1 IMPLANT
SUT VICRYL AB 2 0 TIES (SUTURE) ×3 IMPLANT
SUTURE FIBERWR #2 38 T-5 BLUE (SUTURE) ×2 IMPLANT
SYR CONTROL 10ML LL (SYRINGE) ×3 IMPLANT
TAPE CLOTH SURG 6X10 WHT LF (GAUZE/BANDAGES/DRESSINGS) ×3 IMPLANT
TOWEL OR 17X24 6PK STRL BLUE (TOWEL DISPOSABLE) ×3 IMPLANT
TOWEL OR 17X26 10 PK STRL BLUE (TOWEL DISPOSABLE) ×3 IMPLANT
TOWER SMARTMIX MINI (MISCELLANEOUS) ×1 IMPLANT
TRAY FOLEY CATH 16FRSI W/METER (SET/KITS/TRAYS/PACK) ×3 IMPLANT
WATER STERILE IRR 1000ML POUR (IV SOLUTION) ×3 IMPLANT
YANKAUER SUCT BULB TIP NO VENT (SUCTIONS) IMPLANT

## 2016-03-09 NOTE — Progress Notes (Signed)
Patient setup on CPAP. Auto mode and will call when ready for placement

## 2016-03-09 NOTE — Interval H&P Note (Signed)
History and Physical Interval Note:  03/09/2016 12:15 PM  Zachary Alvarado  has presented today for surgery, with the diagnosis of LEFT SHOULDER OA   The various methods of treatment have been discussed with the patient and family. After consideration of risks, benefits and other options for treatment, the patient has consented to  Procedure(s): LEFT TOTAL SHOULDER ARTHROPLASTY (Left) as a surgical intervention .  The patient's history has been reviewed, patient examined, no change in status, stable for surgery.  I have reviewed the patient's chart and labs.  Questions were answered to the patient's satisfaction.     Geovany Trudo,STEVEN R

## 2016-03-09 NOTE — Anesthesia Preprocedure Evaluation (Addendum)
Anesthesia Evaluation  Patient identified by MRN, date of birth, ID band Patient awake    Reviewed: Allergy & Precautions, NPO status , Patient's Chart, lab work & pertinent test results  Airway Mallampati: I  TM Distance: >3 FB Neck ROM: Full    Dental  (+) Teeth Intact, Dental Advisory Given, Implants,    Pulmonary sleep apnea and Continuous Positive Airway Pressure Ventilation ,    Pulmonary exam normal breath sounds clear to auscultation       Cardiovascular negative cardio ROS Normal cardiovascular exam Rhythm:Regular Rate:Normal     Neuro/Psych PSYCHIATRIC DISORDERS Anxiety Depression Chronic left arm weakness    GI/Hepatic Neg liver ROS, GERD  Medicated,  Endo/Other  negative endocrine ROS  Renal/GU Renal InsufficiencyRenal disease     Musculoskeletal  (+) Arthritis , Osteoarthritis,    Abdominal   Peds  Hematology negative hematology ROS (+)   Anesthesia Other Findings Day of surgery medications reviewed with the patient.  Reproductive/Obstetrics                            Anesthesia Physical Anesthesia Plan  ASA: II  Anesthesia Plan: General and Regional   Post-op Pain Management:  Regional for Post-op pain   Induction: Intravenous  Airway Management Planned: Oral ETT  Additional Equipment:   Intra-op Plan:   Post-operative Plan: Extubation in OR  Informed Consent: I have reviewed the patients History and Physical, chart, labs and discussed the procedure including the risks, benefits and alternatives for the proposed anesthesia with the patient or authorized representative who has indicated his/her understanding and acceptance.   Dental advisory given  Plan Discussed with: CRNA  Anesthesia Plan Comments: (Risks/benefits of general anesthesia discussed with patient including risk of damage to teeth, lips, gum, and tongue, nausea/vomiting, allergic reactions to  medications, and the possibility of heart attack, stroke and death.  All patient questions answered.  Patient wishes to proceed.  Discussed risks and benefits of interscalene block including failure, bleeding, infection, nerve damage, weakness, shortness of breath, pneumothorax. Questions answered. Patient consents to block. )        Anesthesia Quick Evaluation

## 2016-03-09 NOTE — Brief Op Note (Signed)
03/09/2016  2:58 PM  PATIENT:  Zachary Alvarado  65 y.o. male  PRE-OPERATIVE DIAGNOSIS:  LEFT SHOULDER OA, END STAGE  POST-OPERATIVE DIAGNOSIS:  LEFT SHOULDER OA,END STAGE  PROCEDURE:  Procedure(s): LEFT TOTAL SHOULDER ARTHROPLASTY (Left) DePuy [employer] Unite  SURGEON:  Surgeon(s) and Role:    * Netta Cedars, MD - Primary  PHYSICIAN ASSISTANT:   ASSISTANTS: Ventura Bruns, PA-C   ANESTHESIA:   regional and general  EBL:  Total I/O In: 1000 [I.V.:1000] Out: 180 [Blood:180]  BLOOD ADMINISTERED:none  DRAINS: none   LOCAL MEDICATIONS USED:  MARCAINE     SPECIMEN:  No Specimen  DISPOSITION OF SPECIMEN:  N/A  COUNTS:  YES  TOURNIQUET:  * No tourniquets in log *  DICTATION: .Other Dictation: Dictation Number [id number]  PLAN OF CARE: Admit to inpatient   PATIENT DISPOSITION:  PACU - hemodynamically stable.   Delay start of Pharmacological VTE agent (>24hrs) due to surgical blood loss or risk of bleeding: not applicable

## 2016-03-09 NOTE — Anesthesia Procedure Notes (Addendum)
Procedure Name: Intubation Date/Time: 03/09/2016 12:35 PM Performed by: Salli Quarry Trellis Guirguis Pre-anesthesia Checklist: Patient identified, Emergency Drugs available, Suction available and Patient being monitored Patient Re-evaluated:Patient Re-evaluated prior to inductionOxygen Delivery Method: Circle System Utilized Preoxygenation: Pre-oxygenation with 100% oxygen Intubation Type: IV induction Ventilation: Mask ventilation without difficulty Laryngoscope Size: Mac and 3 Grade View: Grade III Tube type: Oral Tube size: 7.5 mm Number of attempts: 1 Airway Equipment and Method: Stylet Placement Confirmation: ETT inserted through vocal cords under direct vision,  positive ETCO2 and breath sounds checked- equal and bilateral Secured at: 24 cm Tube secured with: Tape Dental Injury: Teeth and Oropharynx as per pre-operative assessment

## 2016-03-09 NOTE — Anesthesia Procedure Notes (Signed)
Anesthesia Regional Block:  Interscalene brachial plexus block  Pre-Anesthetic Checklist: ,, timeout performed, Correct Patient, Correct Site, Correct Laterality, Correct Procedure, Correct Position, site marked, Risks and benefits discussed,  Surgical consent,  Pre-op evaluation,  At surgeon's request and post-op pain management  Laterality: Left  Prep: chloraprep       Needles:  Injection technique: Single-shot  Needle Type: Echogenic Stimulator Needle     Needle Length: 5cm 5 cm Needle Gauge: 22 and 22 G    Additional Needles:  Procedures: ultrasound guided (picture in chart) Interscalene brachial plexus block Narrative:  Injection made incrementally with aspirations every 5 mL.  Performed by: Personally  Anesthesiologist: Dominik Yordy EDWARD  Additional Notes: Functioning IV was confirmed and monitors were applied.  A 50mm 22ga Arrow echogenic stimulator needle was used. Sterile prep and drape, hand hygiene, and sterile gloves were used.  Negative aspiration and negative test dose prior to incremental administration of local anesthetic. The patient tolerated the procedure well.  Ultrasound guidance: relevent anatomy identified, needle position confirmed, local anesthetic spread visualized around nerve(s), vascular puncture avoided.  Image printed for medical record.       

## 2016-03-09 NOTE — Discharge Instructions (Signed)
Ice to the shoulder constantly.  Do exercises every hour while awake.   Pendulums, gentle lap slides and rotation exercises.  Do not push pull or lift with the left arm.  Wear sling out of the house, may remove and hug a pillow in the home,  Keep incision clean and dry and covered for one week, then ok to get it wet.  Follow up with Dr [name] in two weeks in the office [phone]

## 2016-03-09 NOTE — Transfer of Care (Signed)
Immediate Anesthesia Transfer of Care Note  Patient: Zachary Alvarado  Procedure(s) Performed: Procedure(s): LEFT TOTAL SHOULDER ARTHROPLASTY (Left)  Patient Location: PACU  Anesthesia Type:GA combined with regional for post-op pain  Level of Consciousness: awake, alert  and patient cooperative  Airway & Oxygen Therapy: Patient Spontanous Breathing and Patient connected to nasal cannula oxygen  Post-op Assessment: Report given to RN, Post -op Vital signs reviewed and stable and Patient moving all extremities  Post vital signs: Reviewed and stable  Last Vitals:  Vitals:   03/09/16 1205 03/09/16 1221  BP:  (!) 145/100  Pulse: 67 76  Resp: 19 (!) 21  Temp:      Last Pain:  Vitals:   03/09/16 1118  TempSrc: Oral      Patients Stated Pain Goal: 2 ([id number])  Complications: No apparent anesthesia complications

## 2016-03-10 LAB — BASIC METABOLIC PANEL
Anion gap: 6 (ref 5–15)
BUN: 9 mg/dL (ref 6–20)
CO2: 25 mmol/L (ref 22–32)
Calcium: 8.2 mg/dL — ABNORMAL LOW (ref 8.9–10.3)
Chloride: 104 mmol/L (ref 101–111)
Creatinine, Ser: 1.17 mg/dL (ref 0.61–1.24)
GFR calc Af Amer: >60 mL/min (ref >60)
GFR calc non Af Amer: >60 mL/min (ref >60)
Glucose, Bld: 155 mg/dL — ABNORMAL HIGH (ref 65–99)
Potassium: 3.7 mmol/L (ref 3.5–5.1)
Sodium: 135 mmol/L (ref 135–145)

## 2016-03-10 LAB — HEMOGLOBIN AND HEMATOCRIT, BLOOD
HCT: 36.2 % — ABNORMAL LOW (ref 39.0–52.0)
Hemoglobin: 12.3 g/dL — ABNORMAL LOW (ref 13.0–17.0)

## 2016-03-10 NOTE — Evaluation (Signed)
Occupational Therapy Evaluation Patient Details Name: Zachary Alvarado MRN: [id number] DOB: 10-May-1951 Today's Date: 03/10/2016    History of Present Illness s/p L TSA PMH: recent herniated disc   Clinical Impression   Pt was independent in self care and mobility prior to admission. Performed active protocol exercise while in supine, educated in positioning L UE in sling in bed and in chair, NWB precautions and compensatory strategies for ADL. Pt declined up in chair, educated in importance of OOB activity with assist of staff for safety and to manage IV. Will follow acutely.    Follow Up Recommendations   (follow up as per MD)    Equipment Recommendations  None recommended by OT    Recommendations for Other Services       Precautions / Restrictions Precautions Precautions: Shoulder Type of Shoulder Precautions: active protocol Shoulder Interventions: Shoulder sling/immobilizer;For comfort (and sleep) Precaution Booklet Issued: Yes (comment) Required Braces or Orthoses: Sling Restrictions Weight Bearing Restrictions: Yes LUE Weight Bearing: Non weight bearing Other Position/Activity Restrictions: FF 90, abd 60, ER 30      Mobility Bed Mobility Overal bed mobility: Needs Assistance Bed Mobility: Rolling;Sidelying to Sit Rolling: Min guard Sidelying to sit: Min guard       General bed mobility comments: pt up to R side of bed, reinforced log rolling due to recent back injury  Transfers                 General transfer comment: pt declined OOB due to nausea    Balance                                            ADL Overall ADL's : Needs assistance/impaired Eating/Feeding: Independent;Bed level   Grooming: Wash/dry hands;Wash/dry face;Sitting;Set up   Upper Body Bathing: Moderate assistance;Sitting   Lower Body Bathing: Moderate assistance;Sit to/from stand   Upper Body Dressing : Moderate assistance;Sitting   Lower Body  Dressing: Moderate assistance;Sit to/from stand                 General ADL Comments: Educated pt in positioning in bed and chair, sling use and wearing schedule, compensatory strategies for ADL     Vision     Perception     Praxis      Pertinent Vitals/Pain Pain Assessment: Faces Faces Pain Scale: Hurts even more Pain Location: L shoulder with exercises Pain Descriptors / Indicators: Aching;Guarding;Grimacing Pain Intervention(s): Monitored during session;Premedicated before session;Repositioned;Ice applied     Hand Dominance Right   Extremity/Trunk Assessment Upper Extremity Assessment Upper Extremity Assessment: LUE deficits/detail LUE Deficits / Details: performed AROM elbow>hand x 10, AAROM in supine FF, abd, ER to pain tolerance x 10   Lower Extremity Assessment Lower Extremity Assessment: Overall WFL for tasks assessed       Communication Communication Communication: No difficulties   Cognition Arousal/Alertness: Awake/alert Behavior During Therapy: WFL for tasks assessed/performed Overall Cognitive Status: Within Functional Limits for tasks assessed                     General Comments       Exercises       Shoulder Instructions      Home Living Family/patient expects to be discharged to:: Private residence Living Arrangements: Spouse/significant other Available Help at Discharge: Family;Available 24 hours/day  Home Equipment: Toilet riser          Prior Functioning/Environment Level of Independence: Independent        Comments: pt is in OPPT for herniated disc        OT Problem List: Decreased strength;Decreased range of motion;Decreased activity tolerance;Impaired balance (sitting and/or standing);Decreased coordination;Decreased knowledge of precautions;Pain;Impaired UE functional use   OT Treatment/Interventions: Self-care/ADL training;DME and/or AE instruction;Cognitive  remediation/compensation;Therapeutic activities;Patient/family education;Therapeutic exercise    OT Goals(Current goals can be found in the care plan section) Acute Rehab OT Goals Patient Stated Goal: return home OT Goal Formulation: With patient Time For Goal Achievement: 03/17/16 Potential to Achieve Goals: Good ADL Goals Pt/caregiver will Perform Home Exercise Program: Left upper extremity;Increased ROM;Independently (AAROM FF90, abd 60, ER 30, AROM elbow to hand) Additional ADL Goal #1: Pt will perform ADL adhering to shoulder precautions with min assist of wife. Additional ADL Goal #2: Pt and wife will be independent in donning and doffing sling. Additional ADL Goal #3: Pt and wife will be independent in positioning L UE in bed and chair.  OT Frequency: Min 2X/week   Barriers to D/C:            Co-evaluation              End of Session    Activity Tolerance: Patient limited by pain (nausea) Patient left: in bed;with call bell/phone within reach   Time: [id number] OT Time Calculation (min): 40 min Charges:  OT General Charges $OT Visit: 1 Procedure OT Evaluation $OT Eval Moderate Complexity: 1 Procedure OT Treatments $Self Care/Home Management : 8-22 mins $Therapeutic Exercise: 8-22 mins G-Codes:    Malka So 03/10/2016, 9:37 AM  [phone]

## 2016-03-10 NOTE — Anesthesia Postprocedure Evaluation (Signed)
Anesthesia Post Note  Patient: SEATON OAKLEY  Procedure(s) Performed: Procedure(s) (LRB): LEFT TOTAL SHOULDER ARTHROPLASTY (Left)  Patient location during evaluation: PACU Anesthesia Type: General and Regional Level of consciousness: awake and alert Pain management: pain level controlled Vital Signs Assessment: post-procedure vital signs reviewed and stable Respiratory status: spontaneous breathing, nonlabored ventilation, respiratory function stable and patient connected to nasal cannula oxygen Cardiovascular status: blood pressure returned to baseline and stable Postop Assessment: no signs of nausea or vomiting Anesthetic complications: no     Last Vitals:  Vitals:   03/10/16 0008 03/10/16 0445  BP: 107/73 115/76  Pulse: 96 85  Resp: 17 17  Temp: 37.1 C 37.1 C    Last Pain:  Vitals:   03/10/16 0708  TempSrc:   PainSc: 6    Pain Goal: Patients Stated Pain Goal: 2 (03/10/16 0708)               Catalina Gravel

## 2016-03-10 NOTE — Op Note (Signed)
Zachary Alvarado, Zachary Alvarado            ACCOUNT NO.:  [id number]  MEDICAL RECORD NO.:  [id number]  LOCATION:  5N06C                        FACILITY:  [facility]  PHYSICIAN:  Doran Heater. Zachary Alvarado, M.D. DATE OF BIRTH:  1950/10/17  DATE OF PROCEDURE:  03/09/2016 DATE OF DISCHARGE:                              OPERATIVE REPORT   PREOPERATIVE DIAGNOSIS:  Left shoulder end-stage osteoarthritis.  POSTOPERATIVE DIAGNOSIS:  Left shoulder end-stage osteoarthritis.  PROCEDURE PERFORMED:  Left total shoulder arthroplasty using DePuy [employer] Unite system.  ATTENDING SURGEON:  Doran Heater. Zachary Fells, MD.  ASSISTANT:  Abbott Pao. Dixon, PA-C, who scrubbed the entire procedure and necessary for satisfactory completion of surgery.  ANESTHESIA:  General anesthesia was used plus interscalene block.  ESTIMATED BLOOD LOSS:  100 mL.  FLUID REPLACEMENT:  1500 mL crystalloid.  INSTRUMENT COUNTS:  Correct.  COMPLICATIONS:  There were no complications.  ANTIBIOTICS:  Perioperative antibiotics were given.  INDICATIONS:  The patient is a 65 year old male with worsening left shoulder pain secondary to end-stage arthritis.  The patient has had progressive pain despite conservative management and desires operative treatment to restore functional and pain in the shoulder.  Informed consent obtained.  DESCRIPTION OF PROCEDURE:  After an adequate level of anesthesia achieved, the patient was positioned in the modified beach-chair position.  Left shoulder was correctly identified, sterilely prepped and draped in usual manner.  Time-out called.  We entered the left shoulder in standard deltopectoral approach starting at the coracoid process and extending down to the anterior humerus.  Dissection down through subcutaneous tissues using Bovie.  Identified the cephalic vein, took it laterally with the deltoid, pectoralis taken medially.  Conjoint tendon identified, retracted medially.  The deep retractors placed. Subscapularis  release subperiosteally off the lesser tuberosity, tagged for repair.  We then tenodesed the biceps in situ with 0 Vicryl figure- of-eight x2 into the pec tendon.  Next, we went ahead and clipped the biceps.  We placed our Crego elevator over the top of the humeral head at the rotator cuff insertion and a large Crego elevator underneath the humeral neck and then we performed a humeral resection of the head with an oscillating saw with the elbow at the patient's side and about 25 degrees of external rotation.  Once we had the head resected, we removed excess osteophytes with a rongeur, rotating all the way around the back. We did a 360-degree capsular release.  Next, we went ahead and completed our humeral preparation and placed a Brown retractor, extended the shoulder, externally rotating and then placing sequential reamers up to size 12, and then broaching both the 10 and size 12 with appropriate version.  Once we had our broaching and preparation done, we placed our trial implant in and made sure it fit and get fully seated.  We then removed that subluxed the humerus posteriorly.  We did a 360-degree capsular removal as well as glenoid labral removal and found the center point for the glenoid phase.  We identified our axillary nerve and protected that.  We drilled our central guidepin.  We then reamed for the 48 mm glenoid component.  This is the APG glenoid.  We then did our peripheral hand reaming.  Next,  we went ahead and drilled our central peg and then peripheral holes using the guide and impacted a 48 trial glenoid component.  We removed that.  We irrigated thoroughly, dried the holes, and then placed Gel-Foam soaked with thrombin in the 3 peripheral holes and dried those areas.  We then went ahead and used DePuy 1 cement and cemented 3 peripheral holes and impacted the APG in place and allowed the cement to harden.  We then went ahead and drilled holes in the lesser tuberosity,  placed #2 FiberWire suture in that for repair of the subscapularis.  We next used impaction grafting technique with available bone graft from the humeral head and impacted the 12 metaphysis Global Unite system into position and then trialed with the 48 x 18 head, which provided excellent bony coverage and appropriate soft tissue balance.  We reduced that and we were happy with 50% inferior translation, 50% posterior translation.  We then removed the trial head, impacted the real 48 x 18 head in position, dialing the eccentricity superiorly and posteriorly.  It gave perfect coverage.  We then went ahead and reduced the shoulder.  We again happy with balance. We then repaired the subscap anatomically including repair of the rotator interval back to bone.  We next irrigated thoroughly and then repaired the deltopectoral interval with 0 Vicryl suture, followed by 2- 0 Vicryl subcutaneous closure, and 4-0 Monocryl for skin.  Steri-Strips applied followed by sterile dressing.  The patient tolerated the surgery well.     Doran Heater. Zachary Alvarado, M.D.     SRN/MEDQ  D:  03/09/2016  T:  03/10/2016  Job:  [id number]

## 2016-03-10 NOTE — Progress Notes (Signed)
Subjective: 1 Day Post-Op Procedure(s) (LRB): LEFT TOTAL SHOULDER ARTHROPLASTY (Left) Patient reports pain as 4 on 0-10 scale.  We answered Multiple questions concerning his pain meds. I think we helped answer most of his questions.He prefers to wait another day for better pain control.otherwise he seems to be doing fine.  Objective: Vital signs in last 24 hours: Temp:  [97 F (36.1 C)-98.8 F (37.1 C)] 98.8 F (37.1 C) (10/21 0445) Pulse Rate:  [67-96] 85 (10/21 0445) Resp:  [8-21] 17 (10/21 0445) BP: (78-146)/(54-110) 115/76 (10/21 0445) SpO2:  [96 %-100 %] 96 % (10/21 0445) Weight:  [78.9 kg (174 lb)] 78.9 kg (174 lb) (10/20 1118)  Intake/Output from previous day: 10/20 0701 - 10/21 0700 In: 2327.5 [P.O.:240; I.V.:2087.5] Out: 630 [Urine:450; Blood:180] Intake/Output this shift: No intake/output data recorded.   Recent Labs  03/10/16 0419  HGB 12.3*    Recent Labs  03/10/16 0419  HCT 36.2*    Recent Labs  03/10/16 0419  NA 135  K 3.7  CL 104  CO2 25  BUN 9  CREATININE 1.17  GLUCOSE 155*  CALCIUM 8.2*   No results for input(s): LABPT, INR in the last 72 hours.  Neurologically intact  Assessment/Plan: 1 Day Post-Op Procedure(s) (LRB): LEFT TOTAL SHOULDER ARTHROPLASTY (Left) Up with therapy  Anuel Sitter A 03/10/2016, 8:10 AM

## 2016-03-10 NOTE — Progress Notes (Signed)
   03/10/16 1300  OT Visit Information  Last OT Received On 03/10/16  Assistance Needed +1  History of Present Illness s/p L TSA PMH: recent herniated disc  Precautions  Precautions Shoulder  Type of Shoulder Precautions active protocol  Shoulder Interventions Shoulder sling/immobilizer;For comfort (sleep)  Required Braces or Orthoses Sling  Pain Assessment  Pain Assessment 0-10  Pain Score 3  Pain Location L  shoulder  Pain Descriptors / Indicators Sore  Pain Intervention(s) Monitored during session;Premedicated before session;Repositioned  Cognition  Arousal/Alertness Awake/alert  Behavior During Therapy WFL for tasks assessed/performed  Overall Cognitive Status Within Functional Limits for tasks assessed  ADL  Overall ADL's  Needs assistance/impaired  Toilet Transfer Supervision/safety;Ambulation  Toileting- Water quality scientist and Hygiene Supervision/safety;Sit to/from stand  Functional mobility during ADLs Supervision/safety  General ADL Comments Educated pt's wife in positioning in bed and chair, sling use and wearing schedule, compensatory strategies for ADL  Bed Mobility  Overal bed mobility Needs Assistance  Bed Mobility Rolling;Sidelying to Sit  Rolling Min guard  Sidelying to sit Min guard  General bed mobility comments pt up to R side of bed, reinforced log rolling due to recent back injury  Restrictions  Weight Bearing Restrictions Yes  LUE Weight Bearing NWB  Other Position/Activity Restrictions FF 90, abd 60, ER 30  Transfers  Overall transfer level Needs assistance  Transfers Sit to/from Stand  Sit to Stand Supervision  General transfer comment pt ambulated to chair  OT - End of Session  Activity Tolerance Patient tolerated treatment well  Patient left in chair;with call bell/phone within reach;with family/visitor present  OT Assessment/Plan  OT Plan Discharge plan remains appropriate  OT Frequency (ACUTE ONLY) Min 2X/week  Follow Up Recommendations  (follow up as per MD)  OT Equipment None recommended by OT  OT Goal Progression  Progress towards OT goals Progressing toward goals  Acute Rehab OT Goals  Patient Stated Goal return home  Time For Goal Achievement 03/17/16  Potential to Achieve Goals Good  OT Time Calculation  OT Start Time (ACUTE ONLY) 1207  OT Stop Time (ACUTE ONLY) 1229  OT Time Calculation (min) 22 min  OT General Charges  $OT Visit 1 Procedure  OT Treatments  $Self Care/Home Management  8-22 mins  03/10/2016 Nestor Lewandowsky, OTR/L Pager: [phone]

## 2016-03-11 NOTE — Progress Notes (Signed)
Occupational Therapy Treatment Patient Details Name: Zachary Alvarado MRN: [id number] DOB: 1950-05-25 Today's Date: 03/11/2016    History of present illness s/p L TSA PMH: recent herniated disc   OT comments  Pt. Making gains with skilled OT and is eager to complete UE exercises and demonstrate safe techniques.  Wife present for review of exercises/ROM along with sling education.  Note d/c set for later today.    Follow Up Recommendations       Equipment Recommendations  None recommended by OT    Recommendations for Other Services      Precautions / Restrictions Precautions Precautions: Shoulder Type of Shoulder Precautions: active protocol Shoulder Interventions: Shoulder sling/immobilizer;For comfort Required Braces or Orthoses: Sling Restrictions LUE Weight Bearing: Non weight bearing Other Position/Activity Restrictions: FF 90, abd 60, ER 30       Mobility Bed Mobility                  Transfers                      Balance                                   ADL Overall ADL's : Needs assistance/impaired                 Upper Body Dressing : With caregiver independent assisting;Set up Upper Body Dressing Details (indicate cue type and reason): pts. spouse present and able to don sling properly without any assistance                          Vision                     Perception     Praxis      Cognition   Behavior During Therapy: [facility] for tasks assessed/performed Overall Cognitive Status: Within Functional Limits for tasks assessed                       Extremity/Trunk Assessment               Exercises Shoulder Exercises Shoulder Flexion: AROM;Left;5 reps;Standing Shoulder Extension: AROM;Left;5 reps;Standing Shoulder ABduction: Self ROM;Left;5 reps;Standing Shoulder External Rotation: Self ROM;Left;5 reps;Standing Elbow Flexion: AROM;Left;10 reps;Standing Elbow Extension:  AROM;Left;10 reps;Standing Wrist Flexion: AROM;Left;10 reps;Standing Wrist Extension: AROM;Left;Standing;10 reps   Shoulder Instructions       General Comments      Pertinent Vitals/ Pain       Pain Assessment: No/denies pain  Home Living                                          Prior Functioning/Environment              Frequency  Min 2X/week        Progress Toward Goals  OT Goals(current goals can now be found in the care plan section)  Progress towards OT goals: Progressing toward goals     Plan Discharge plan remains appropriate    Co-evaluation                 End of Session     Activity Tolerance Patient tolerated treatment well   Patient Left in bed;with family/visitor  present   Nurse Communication          Time: [phone] OT Time Calculation (min): 19 min  Charges: OT General Charges $OT Visit: 1 Procedure OT Treatments $Therapeutic Exercise: 8-22 mins  Janice Coffin, COTA/L 03/11/2016, 9:40 AM

## 2016-03-11 NOTE — Progress Notes (Signed)
   Subjective: 2 Days Post-Op Procedure(s) (LRB): LEFT TOTAL SHOULDER ARTHROPLASTY (Left)  Pt feeling better today Ready for d/c home Denies any new symptoms or issues Patient reports pain as mild.  Objective:   VITALS:   Vitals:   03/10/16 2015 03/11/16 0644  BP: 133/90 109/75  Pulse: 80 78  Resp: 16 16  Temp: 98 F (36.7 C) 98.2 F (36.8 C)    Left shoulder incision healing well nv intact distally No rashes or edema Sling in place  LABS  Recent Labs  03/10/16 0419  HGB 12.3*  HCT 36.2*     Recent Labs  03/10/16 0419  NA 135  K 3.7  BUN 9  CREATININE 1.17  GLUCOSE 155*     Assessment/Plan: 2 Days Post-Op Procedure(s) (LRB): LEFT TOTAL SHOULDER ARTHROPLASTY (Left) D/c home today F/u in 2 weeks Continue home exercises   Merla Riches, MPAS, PA-C  03/11/2016, 8:14 AM

## 2016-03-11 NOTE — Discharge Summary (Signed)
Physician Discharge Summary   Patient ID: Zachary Alvarado MRN: [id number] DOB/AGE: [birth year] 65 y.o.  Admit date: 03/09/2016 Discharge date: 03/11/2016  Admission Diagnoses:  Active Problems:   S/P shoulder replacement, left   Discharge Diagnoses:  Same   Surgeries: Procedure(s): LEFT TOTAL SHOULDER ARTHROPLASTY on 03/09/2016   Consultants: PT/OT  Discharged Condition: Stable  Hospital Course: Zachary Alvarado is an 65 y.o. male who was admitted 03/09/2016 with a chief complaint of left shoulder pain, and found to have a diagnosis of left shoulder end stage osteoarthritis.  They were brought to the operating room on 03/09/2016 and underwent the above named procedures.    The patient had an uncomplicated hospital course and was stable for discharge.  Recent vital signs:  Vitals:   03/10/16 2015 03/11/16 0644  BP: 133/90 109/75  Pulse: 80 78  Resp: 16 16  Temp: 98 F (36.7 C) 98.2 F (36.8 C)    Recent laboratory studies:  Results for orders placed or performed during the hospital encounter of 03/09/16  Hemoglobin and hematocrit, blood  Result Value Ref Range   Hemoglobin 12.3 (L) 13.0 - 17.0 g/dL   HCT 36.2 (L) 39.0 - [id number] %  Basic metabolic panel  Result Value Ref Range   Sodium 135 135 - 145 mmol/L   Potassium 3.7 3.5 - 5.1 mmol/L   Chloride 104 101 - 111 mmol/L   CO2 25 22 - 32 mmol/L   Glucose, Bld 155 (H) 65 - 99 mg/dL   BUN 9 6 - 20 mg/dL   Creatinine, Ser 1.17 0.61 - 1.24 mg/dL   Calcium 8.2 (L) 8.9 - 10.3 mg/dL   GFR calc non Af Amer >60 >60 mL/min   GFR calc Af Amer >60 >60 mL/min   Anion gap 6 5 - 15    Discharge Medications:     Medication List    STOP taking these medications   azithromycin 250 MG tablet Commonly known as:  ZITHROMAX Z-PAK   meclizine 25 MG tablet Commonly known as:  ANTIVERT   ondansetron 4 MG tablet Commonly known as:  ZOFRAN     TAKE these medications   aspirin 81 MG tablet Take 81 mg by mouth daily.     azelastine 0.1 % nasal spray Commonly known as:  ASTELIN Place 2 sprays into both nostrils 2 (two) times daily as needed for allergies. Use in each nostril as directed   CALCIUM-VITAMIN D PO Take 1 tablet by mouth daily.   carisoprodol 350 MG tablet Commonly known as:  SOMA Take 350 mg by mouth daily as needed for muscle spasms. Take as directed   colchicine 0.6 MG tablet Take 0.6 mg by mouth 2 (two) times daily as needed (gout flare up). Gout flare up   diclofenac sodium 1 % Gel Commonly known as:  VOLTAREN Apply 1 application topically 3 (three) times daily.   glucosamine-chondroitin 500-400 MG tablet Take 1 tablet by mouth daily.   HYDROcodone-acetaminophen 5-325 MG tablet Commonly known as:  NORCO Take 1 tablet by mouth every 6 (six) hours as needed for moderate pain.   methocarbamol 500 MG tablet Commonly known as:  ROBAXIN Take 500 mg by mouth 3 (three) times daily. What changed:  Another medication with the same name was added. Make sure you understand how and when to take each.   methocarbamol 500 MG tablet Commonly known as:  ROBAXIN Take 1 tablet (500 mg total) by mouth 3 (three) times daily as needed. What changed:  You were already taking a medication with the same name, and this prescription was added. Make sure you understand how and when to take each.   NON FORMULARY CPAP Machine as directed   psyllium 58.6 % powder Commonly known as:  METAMUCIL Take 1 packet by mouth daily as needed (pt said he was taking it for cholestrol control). As neded for constipation   traMADol 50 MG tablet Commonly known as:  ULTRAM Take 50-100 mg by mouth every 6 (six) hours as needed.   triamcinolone lotion 0.1 % Commonly known as:  KENALOG Apply 1 application topically 3 (three) times daily.   triamcinolone cream 0.1 % Commonly known as:  KENALOG Apply 1 application topically 2 (two) times daily as needed (rash -- in addition to lotion).       Diagnostic Studies:  Dg Shoulder Left Port  Result Date: 03/09/2016 CLINICAL DATA:  Left shoulder replacement. EXAM: LEFT SHOULDER - 1 VIEW COMPARISON:  No recent prior. FINDINGS: Total left shoulder replacement. Hardware intact. Good anatomic alignment. No acute bony abnormality identified. Atelectatic changes left lung base . IMPRESSION: Total left shoulder replacement. Hardware intact. Good anatomic alignment . Electronically Signed   By: Marcello Moores  Register   On: 03/09/2016 15:48    Disposition: 01-Home or Self Care  Discharge Instructions    Call MD / Call 911    Complete by:  As directed    If you experience chest pain or shortness of breath, CALL 911 and be transported to the hospital emergency room.  If you develope a fever above 101 F, pus (white drainage) or increased drainage or redness at the wound, or calf pain, call your surgeon's office.   Constipation Prevention    Complete by:  As directed    Drink plenty of fluids.  Prune juice may be helpful.  You may use a stool softener, such as Colace (over the counter) 100 mg twice a day.  Use MiraLax (over the counter) for constipation as needed.   Diet - low sodium heart healthy    Complete by:  As directed    Increase activity slowly as tolerated    Complete by:  As directed       Follow-up Information    NORRIS,STEVEN R, MD. Call in 2 weeks.   Specialty:  Orthopedic Surgery Why:  [phone] Contact information: [address] [place] [address] [phone]            Signed: Ventura Bruns 03/11/2016, 8:16 AM

## 2016-03-11 NOTE — Progress Notes (Signed)
Discharge papers reviewed with patient, patient states understanding. Prescriptions given to client, IV removed. Family available for transportation Product manager, Therapist, sports

## 2016-03-11 NOTE — Progress Notes (Signed)
Patient places himself on CPAP with assistance from RN. No issues at this time. CPAP is setup and ready to go

## 2016-03-12 ENCOUNTER — Encounter (HOSPITAL_COMMUNITY): Payer: Self-pay | Admitting: Orthopedic Surgery

## 2016-03-13 DIAGNOSIS — M5416 Radiculopathy, lumbar region: Secondary | ICD-10-CM | POA: Diagnosis not present

## 2016-03-13 DIAGNOSIS — M545 Low back pain: Secondary | ICD-10-CM | POA: Diagnosis not present

## 2016-03-14 DIAGNOSIS — M5416 Radiculopathy, lumbar region: Secondary | ICD-10-CM | POA: Diagnosis not present

## 2016-03-21 DIAGNOSIS — M19012 Primary osteoarthritis, left shoulder: Secondary | ICD-10-CM | POA: Diagnosis not present

## 2016-03-22 DIAGNOSIS — M5416 Radiculopathy, lumbar region: Secondary | ICD-10-CM | POA: Diagnosis not present

## 2016-03-27 DIAGNOSIS — M19012 Primary osteoarthritis, left shoulder: Secondary | ICD-10-CM | POA: Diagnosis not present

## 2016-03-27 DIAGNOSIS — Z471 Aftercare following joint replacement surgery: Secondary | ICD-10-CM | POA: Diagnosis not present

## 2016-03-27 DIAGNOSIS — Z96612 Presence of left artificial shoulder joint: Secondary | ICD-10-CM | POA: Diagnosis not present

## 2016-03-28 DIAGNOSIS — M19012 Primary osteoarthritis, left shoulder: Secondary | ICD-10-CM | POA: Diagnosis not present

## 2016-03-28 DIAGNOSIS — M5416 Radiculopathy, lumbar region: Secondary | ICD-10-CM | POA: Diagnosis not present

## 2016-04-04 DIAGNOSIS — M19012 Primary osteoarthritis, left shoulder: Secondary | ICD-10-CM | POA: Diagnosis not present

## 2016-04-04 DIAGNOSIS — M5416 Radiculopathy, lumbar region: Secondary | ICD-10-CM | POA: Diagnosis not present

## 2016-04-10 DIAGNOSIS — M19012 Primary osteoarthritis, left shoulder: Secondary | ICD-10-CM | POA: Diagnosis not present

## 2016-04-10 DIAGNOSIS — M5416 Radiculopathy, lumbar region: Secondary | ICD-10-CM | POA: Diagnosis not present

## 2016-04-15 IMAGING — CT CT HEAD W/O CM
2 series · 17 of 30 positions shown, 20 images · non-contrast
Comparison: None.

CLINICAL DATA: Nausea, vomiting and dizziness beginning at 3 a.m.
this morning.

EXAM:
CT HEAD WITHOUT CONTRAST
TECHNIQUE: Contiguous axial images were obtained from the base of the skull
through the vertex without intravenous contrast.

[Series 2: head w/o · axial · non-contrast · 0.44mm/px · z∈[+757,+877]mm · 9 of 32 slices shown, 12 images]
[im 4/32  brain]
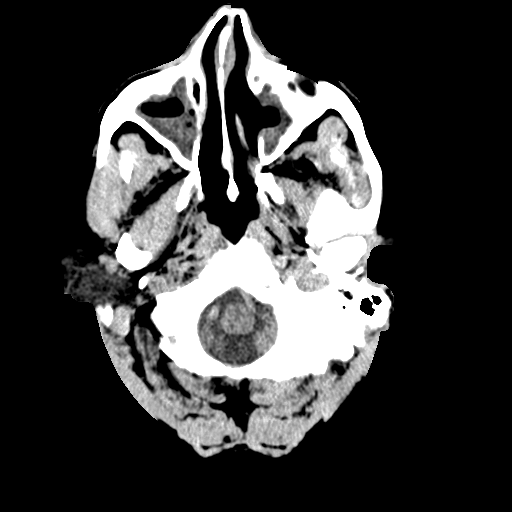
[im 4/32  bone]
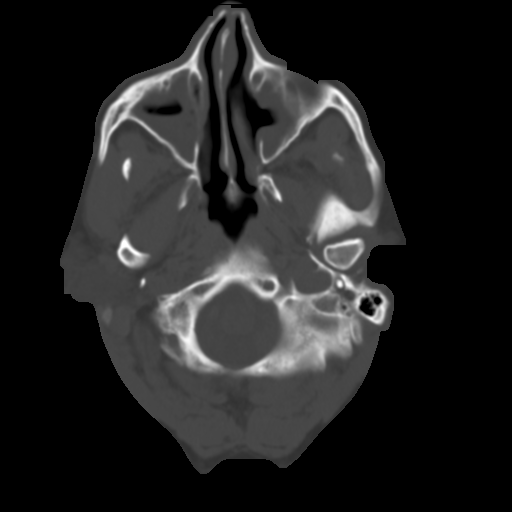
[im 7/32  brain]
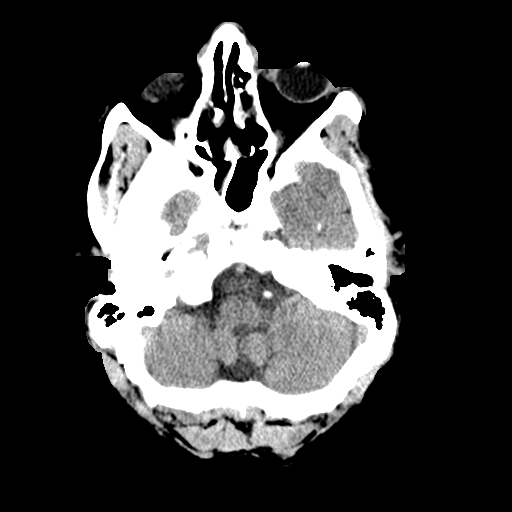
[im 10/32  brain]
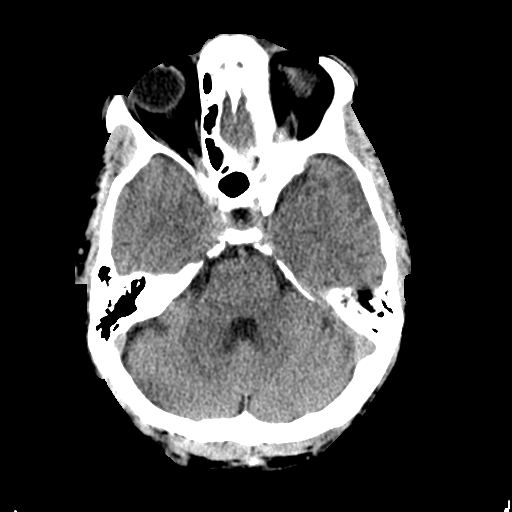
[im 13/32  brain]
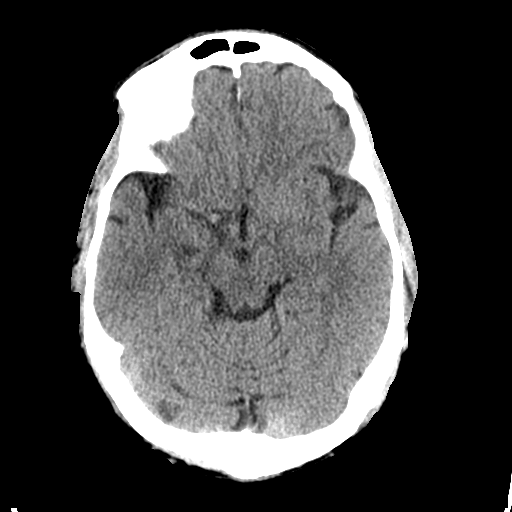
[im 16/32  brain]
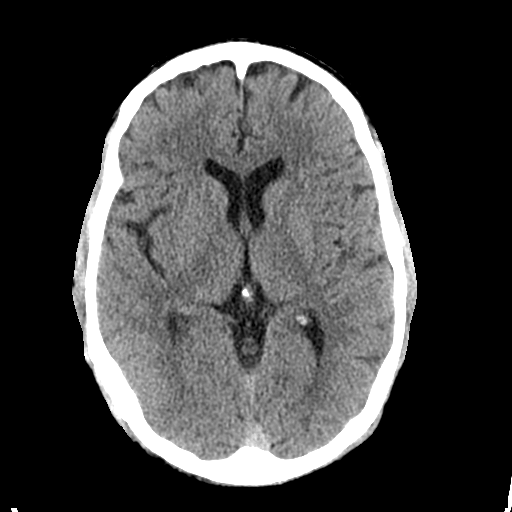
[im 16/32  bone]
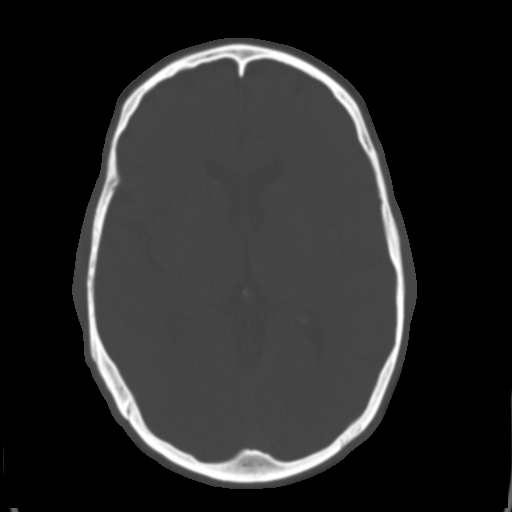
[im 19/32  brain]
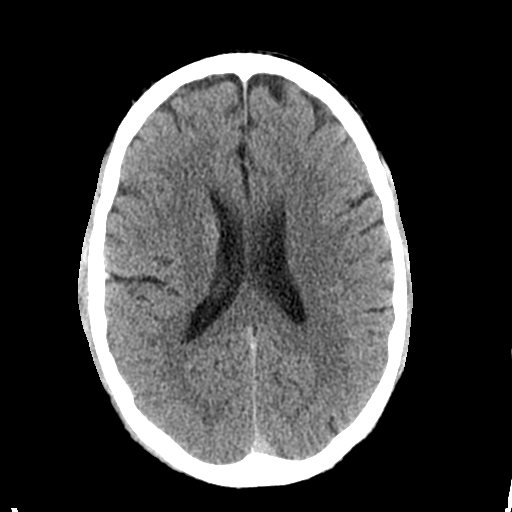
[im 22/32  brain]
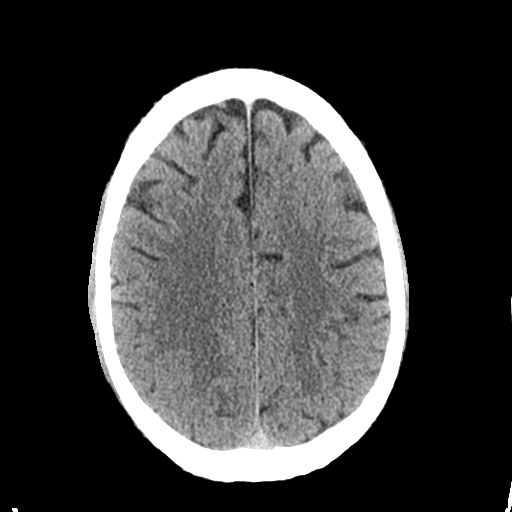
[im 25/32  brain]
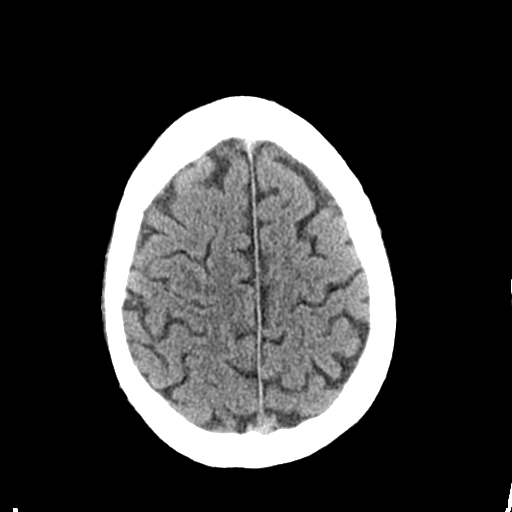
[im 28/32  brain]
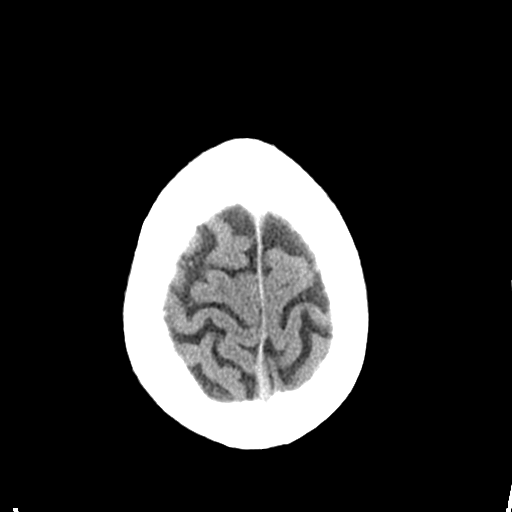
[im 28/32  bone]
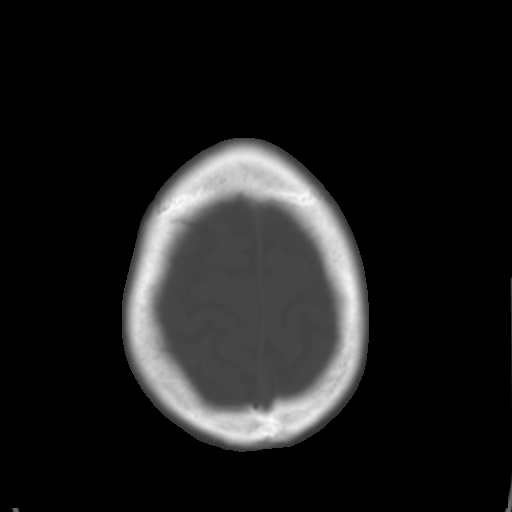

[Series 3: bone windows · axial · 0.44mm/px · z∈[+757,+880]mm · 8 of 53 slices shown]
[im 6/53  bone]
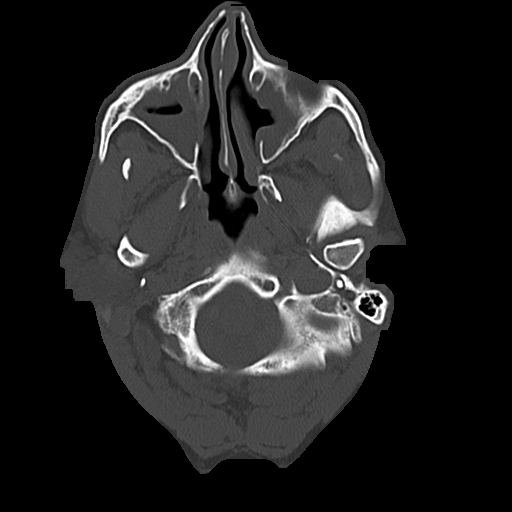
[im 12/53  bone]
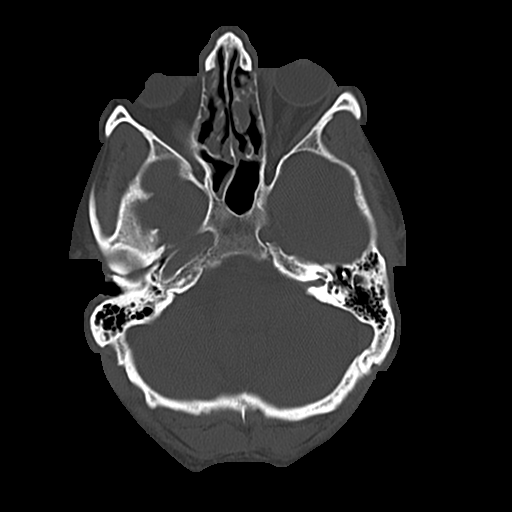
[im 18/53  bone]
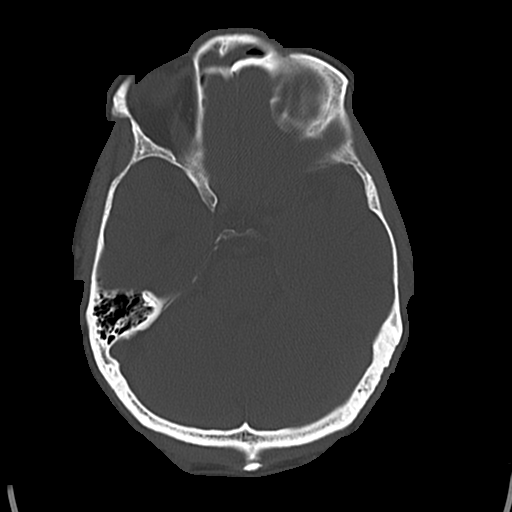
[im 24/53  bone]
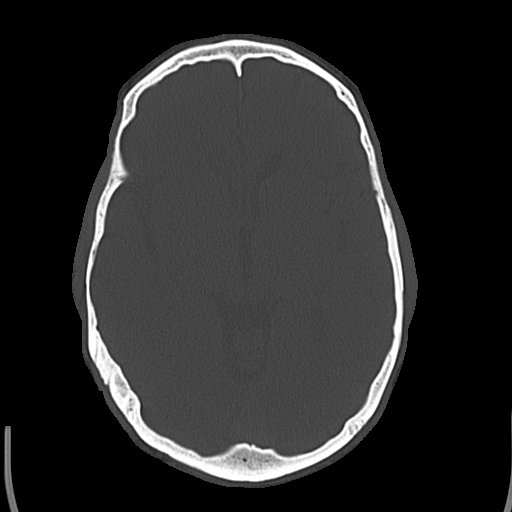
[im 29/53  bone]
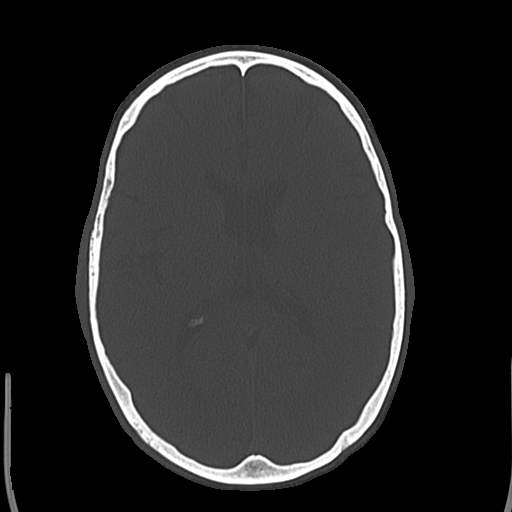
[im 35/53  bone]
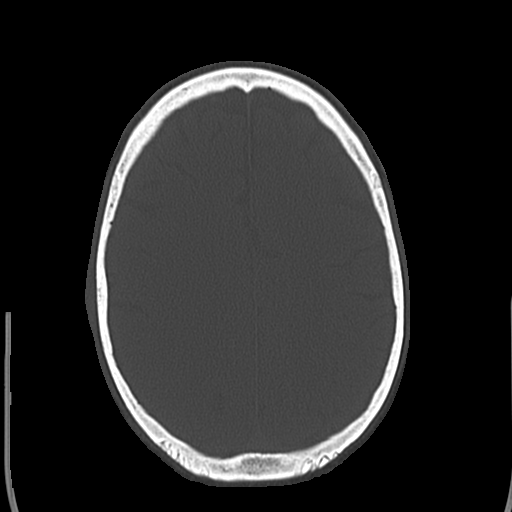
[im 41/53  bone]
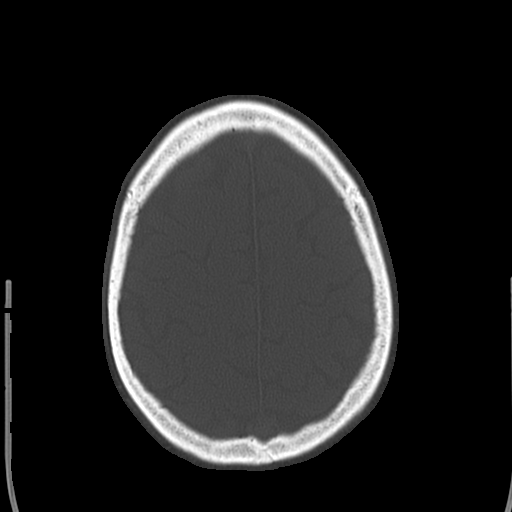
[im 47/53  bone]
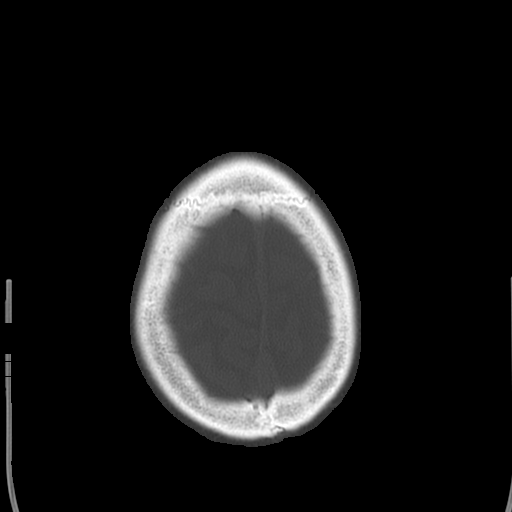

[17 of 30 positions shown; findings below may reference images not displayed]

FINDINGS: The brain appears normal without hemorrhage, infarct, mass lesion,
mass effect, midline shift or abnormal extra-axial fluid collection.
No hydrocephalus or pneumocephalus. The calvarium is intact.

Marked mucosal thickening is seen in the maxillary sinuses
bilaterally, worse on the right, where an air-fluid level is
identified. Mucosal thickening is also seen in scattered ethmoid air
cells and in the frontal sinuses. The mastoid air cells are clear.
IMPRESSION: No acute intracranial abnormality.

Marked sinus disease with an air-fluid level in the right maxillary
compatible with acute sinusitis.

## 2016-04-17 DIAGNOSIS — M19012 Primary osteoarthritis, left shoulder: Secondary | ICD-10-CM | POA: Diagnosis not present

## 2016-04-17 DIAGNOSIS — M5416 Radiculopathy, lumbar region: Secondary | ICD-10-CM | POA: Diagnosis not present

## 2016-04-19 ENCOUNTER — Telehealth: Payer: Self-pay | Admitting: *Deleted

## 2016-04-19 NOTE — Telephone Encounter (Signed)
Patient called to ask for a CPAP replacement and he had not been seen in the office since 2014.I explained that he needed an appointment. I offered him 06/14/2016 and he declined that appointment. I then spoke to Thomes Dinning who had an earlier appointment of 04/24/2016 and he declined that one too. He is upset that for 3 years he never called to schedule an appointment because he never received a letter from [place].

## 2016-04-24 DIAGNOSIS — M19012 Primary osteoarthritis, left shoulder: Secondary | ICD-10-CM | POA: Diagnosis not present

## 2016-04-24 DIAGNOSIS — M5416 Radiculopathy, lumbar region: Secondary | ICD-10-CM | POA: Diagnosis not present

## 2016-04-24 DIAGNOSIS — Z471 Aftercare following joint replacement surgery: Secondary | ICD-10-CM | POA: Diagnosis not present

## 2016-04-24 DIAGNOSIS — Z96612 Presence of left artificial shoulder joint: Secondary | ICD-10-CM | POA: Diagnosis not present

## 2016-05-01 DIAGNOSIS — M5416 Radiculopathy, lumbar region: Secondary | ICD-10-CM | POA: Diagnosis not present

## 2016-05-01 DIAGNOSIS — M19012 Primary osteoarthritis, left shoulder: Secondary | ICD-10-CM | POA: Diagnosis not present

## 2016-05-02 ENCOUNTER — Encounter: Payer: Self-pay | Admitting: Cardiology

## 2016-05-02 ENCOUNTER — Ambulatory Visit (INDEPENDENT_AMBULATORY_CARE_PROVIDER_SITE_OTHER): Payer: Medicare Other | Admitting: Cardiology

## 2016-05-02 ENCOUNTER — Encounter (INDEPENDENT_AMBULATORY_CARE_PROVIDER_SITE_OTHER): Payer: Self-pay

## 2016-05-02 VITALS — BP 110/82 | HR 76 | Ht 69.0 in | Wt 179.8 lb

## 2016-05-02 DIAGNOSIS — G4733 Obstructive sleep apnea (adult) (pediatric): Secondary | ICD-10-CM | POA: Diagnosis not present

## 2016-05-02 DIAGNOSIS — Z9989 Dependence on other enabling machines and devices: Secondary | ICD-10-CM | POA: Diagnosis not present

## 2016-05-02 NOTE — Patient Instructions (Signed)
Medication Instructions:  Your physician recommends that you continue on your current medications as directed. Please refer to the Current Medication list given to you today.   Labwork: None  Testing/Procedures: None  Follow-Up: Your physician wants you to follow-up in: 1 year with Dr. Radford Pax. You will receive a reminder letter in the mail two months in advance. If you don't receive a letter, please call our office to schedule the follow-up appointment.   Any Other Special Instructions Will Be Listed Below (If Applicable). Dr. Radford Pax has ordered for you a new CPAP machine.  Gae Bon is our office CPAP assistant. Her direct number is [phone].    If you need a refill on your cardiac medications before your next appointment, please call your pharmacy.

## 2016-05-02 NOTE — Progress Notes (Addendum)
Cardiology Office Note    Date:  09/16/2020   ID:  Zachary Alvarado., DOB [birth year], MRN [id number]  PCP:  Lujean Amel, MD  Cardiologist:  Fransico Him, MD   Chief Complaint  Patient presents with  . Sleep Apnea  . Hypertension    History of Present Illness:  Zachary Alvarado. is a 65 y.o. male with a history of OSA who presents today for followup of his OSA.  He is doing well today.  He tolerates his CPAP device well.  He tolerate the full face mask and feels the pressure is adequate.  He feels rested when he gets up in the am and has no daytime sleepiness.  He says that he doesn't think the machine has been working recently and is 65 years old. He has not seen me since 2014.  He has some mouth dryness and uses Biotene.  He has no significant nasal congestion.     Past Medical History:  Diagnosis Date  . Adenomatous colon polyp    Dr Watt Climes 2004  . Anxiety    social   . Arthritis   . Depression    at one point in his life  . GERD (gastroesophageal reflux disease)    occasionally, take OTC meds x 14 days  . Gout of big toe    in right foot  . Hemorrhoid   . Hypercholesterolemia   . Kidney stones, calcium oxalate   . OSA on CPAP   . Ruptured lumbar disc 2017   treated with - epidural injection, , treated with Dr. Rolena Infante & Ramos  . Seasonal allergies   . Wears glasses     Past Surgical History:  Procedure Laterality Date  . COLONOSCOPY    . ETHMOIDECTOMY N/A 04/03/2013   Procedure: ETHMOIDECTOMY;  Surgeon: Izora Gala, MD;  Location: [facility];  Service: ENT;  Laterality: N/A;  . FINGER TENDON REPAIR  2008   and arteries-lt middle finger  . KIDNEY STONE SURGERY  1996   calcium oxalate kidney stone  . MASS EXCISION Left 09/10/2018   Procedure: Left long finger debridement and drainage, mass removal;  Surgeon: Iran Planas, MD;  Location: [facility];  Service: Orthopedics;  Laterality: Left;  29min  . NASAL SINUS SURGERY   1996  . POLYPECTOMY Bilateral 04/03/2013   Procedure: BILATERAL NASAL POLYPECTOMY ;  Surgeon: Izora Gala, MD;  Location: [facility];  Service: ENT;  Laterality: Bilateral;  . TOTAL SHOULDER ARTHROPLASTY Left 03/09/2016   Procedure: LEFT TOTAL SHOULDER ARTHROPLASTY;  Surgeon: Netta Cedars, MD;  Location: [facility];  Service: Orthopedics;  Laterality: Left;    Current Medications: Outpatient Medications Prior to Visit  Medication Sig Dispense Refill  . aspirin 81 MG tablet Take 81 mg by mouth daily.    Marland Kitchen azelastine (ASTELIN) 137 MCG/SPRAY nasal spray Place 2 sprays into both nostrils 2 (two) times daily as needed for allergies. Use in each nostril as directed    . carisoprodol (SOMA) 350 MG tablet Take 350 mg by mouth daily as needed for muscle spasms. Take as directed    . colchicine 0.6 MG tablet Take 0.6 mg by mouth 2 (two) times daily as needed (gout flare up). Gout flare up  0  . diclofenac sodium (VOLTAREN) 1 % GEL Apply 2 g topically daily as needed (pain).    . CALCIUM-VITAMIN D PO Take 1 tablet by mouth daily.    Marland Kitchen glucosamine-chondroitin 500-400 MG tablet Take  1 tablet by mouth daily as needed.     Marland Kitchen HYDROcodone-acetaminophen (NORCO) 5-325 MG tablet Take 1 tablet by mouth every 6 (six) hours as needed for moderate pain. 40 tablet 0  . methocarbamol (ROBAXIN) 500 MG tablet Take 1 tablet (500 mg total) by mouth 3 (three) times daily as needed. (Patient not taking: Reported on 07/18/2016) 60 tablet 1  . NON FORMULARY CPAP Machine as directed    . psyllium (METAMUCIL) 58.6 % powder Take 1 packet by mouth daily as needed (pt said he was taking it for cholestrol control). As neded for constipation    . traMADol (ULTRAM) 50 MG tablet Take 50-100 mg by mouth every 6 (six) hours as needed.    . triamcinolone cream (KENALOG) 0.1 % Apply 1 application topically 2 (two) times daily as needed (rash -- in addition to lotion).    Marland Kitchen diclofenac sodium (VOLTAREN) 1 % GEL Apply 1 application  topically 3 (three) times daily.  0  . methocarbamol (ROBAXIN) 500 MG tablet Take 500 mg by mouth 3 (three) times daily.    Marland Kitchen triamcinolone lotion (KENALOG) 0.1 % Apply 1 application topically 3 (three) times daily.     No facility-administered medications prior to visit.     Allergies:   Wasp venom, Percocet [oxycodone-acetaminophen], and Penicillins   Social History   Socioeconomic History  . Marital status: Married    Spouse name: Not on file  . Number of children: Not on file  . Years of education: Not on file  . Highest education level: Not on file  Occupational History  . Occupation: self employed    Employer: [employer]  Tobacco Use  . Smoking status: Never Smoker  . Smokeless tobacco: Never Used  Vaping Use  . Vaping Use: Never used  Substance and Sexual Activity  . Alcohol use: Yes    Comment: 2 drinks daily - lessening intake in past 2 mths.   . Drug use: No  . Sexual activity: Not on file  Other Topics Concern  . Not on file  Social History Narrative   Alcohol : yes   Caffeine : yes 1 serving daily,coffee,soda,    No recreational drug use      Self employed   Children -2 girls               Social Determinants of Radio broadcast assistant Strain: Not on file  Food Insecurity: Not on file  Transportation Needs: Not on file  Physical Activity: Not on file  Stress: Not on file  Social Connections: Not on file     Family History:  The patient's family history includes Alzheimer's disease in his father; Hypertension in his sister; Leukemia in his mother.   ROS:   Please see the history of present illness.    ROS All other systems reviewed and are negative.  No flowsheet data found.     PHYSICAL EXAM:   VS:  BP 110/82   Pulse 76   Ht 5\' 9"  (1.753 m)   Wt 179 lb 12.8 oz (81.6 kg)   SpO2 99%   BMI 26.55 kg/m    GEN: Well nourished, well developed, in no acute distress  HEENT: normal  Neck: no JVD, carotid bruits, or masses Cardiac:  RRR; no murmurs, rubs, or gallops,no edema.  Intact distal pulses bilaterally.  Respiratory:  clear to auscultation bilaterally, normal work of breathing GI: soft, nontender, nondistended, + BS MS: no deformity or atrophy  Skin: warm  and dry, no rash Neuro:  Alert and Oriented x 3, Strength and sensation are intact Psych: euthymic mood, full affect  Wt Readings from Last 3 Encounters:  09/07/20 179 lb 3.2 oz (81.3 kg)  09/14/19 180 lb (81.6 kg)  09/12/18 179 lb (81.2 kg)      Studies/Labs Reviewed:   EKG:  EKG is not ordered today.    Recent Labs: No results found for requested labs within last 8760 hours.   Lipid Panel No results found for: CHOL, TRIG, HDL, CHOLHDL, VLDL, LDLCALC, LDLDIRECT  Additional studies/ records that were reviewed today include:  CPAp download    ASSESSMENT:    1. OSA on CPAP      PLAN:  In order of problems listed above:  OSA - the patient is tolerating PAP therapy well without any problems. The PAP download was reviewed today and showed an AHI of 1.7/hr on 13 cm H2O with 67% compliance in using more than 4 hours nightly.  The patient has been using and benefiting from CPAP use and will continue to benefit from therapy. He would like to get a new machine since his is 65 years old.  I will order one for him today.      Medication Adjustments/Labs and Tests Ordered: Current medicines are reviewed at length with the patient today.  Concerns regarding medicines are outlined above.  Medication changes, Labs and Tests ordered today are listed in the Patient Instructions below.  Patient Instructions  Medication Instructions:  Your physician recommends that you continue on your current medications as directed. Please refer to the Current Medication list given to you today.   Labwork: None  Testing/Procedures: None  Follow-Up: Your physician wants you to follow-up in: 1 year with Dr. Radford Pax. You will receive a reminder letter in the mail two  months in advance. If you don't receive a letter, please call our office to schedule the follow-up appointment.   Any Other Special Instructions Will Be Listed Below (If Applicable). Dr. Radford Pax has ordered for you a new CPAP machine.  Gae Bon is our office CPAP assistant. Her direct number is [phone].    If you need a refill on your cardiac medications before your next appointment, please call your pharmacy.      Signed, Fransico Him, MD  09/16/2020 10:52 AM    [facility] [place], [place],   [address] Phone: [phone]; Fax: [phone]

## 2016-05-08 DIAGNOSIS — M19012 Primary osteoarthritis, left shoulder: Secondary | ICD-10-CM | POA: Diagnosis not present

## 2016-05-16 DIAGNOSIS — M19012 Primary osteoarthritis, left shoulder: Secondary | ICD-10-CM | POA: Diagnosis not present

## 2016-05-23 DIAGNOSIS — M19012 Primary osteoarthritis, left shoulder: Secondary | ICD-10-CM | POA: Diagnosis not present

## 2016-05-28 DIAGNOSIS — M5416 Radiculopathy, lumbar region: Secondary | ICD-10-CM | POA: Diagnosis not present

## 2016-05-28 DIAGNOSIS — M545 Low back pain: Secondary | ICD-10-CM | POA: Diagnosis not present

## 2016-05-29 DIAGNOSIS — M19012 Primary osteoarthritis, left shoulder: Secondary | ICD-10-CM | POA: Diagnosis not present

## 2016-06-05 DIAGNOSIS — Z471 Aftercare following joint replacement surgery: Secondary | ICD-10-CM | POA: Diagnosis not present

## 2016-06-05 DIAGNOSIS — M19012 Primary osteoarthritis, left shoulder: Secondary | ICD-10-CM | POA: Diagnosis not present

## 2016-06-05 DIAGNOSIS — Z96612 Presence of left artificial shoulder joint: Secondary | ICD-10-CM | POA: Diagnosis not present

## 2016-06-12 DIAGNOSIS — M19012 Primary osteoarthritis, left shoulder: Secondary | ICD-10-CM | POA: Diagnosis not present

## 2016-06-19 DIAGNOSIS — M19012 Primary osteoarthritis, left shoulder: Secondary | ICD-10-CM | POA: Diagnosis not present

## 2016-06-26 DIAGNOSIS — M19012 Primary osteoarthritis, left shoulder: Secondary | ICD-10-CM | POA: Diagnosis not present

## 2016-07-03 DIAGNOSIS — M19012 Primary osteoarthritis, left shoulder: Secondary | ICD-10-CM | POA: Diagnosis not present

## 2016-07-10 DIAGNOSIS — M19012 Primary osteoarthritis, left shoulder: Secondary | ICD-10-CM | POA: Diagnosis not present

## 2016-07-16 ENCOUNTER — Encounter: Payer: Self-pay | Admitting: Cardiology

## 2016-07-17 DIAGNOSIS — M19012 Primary osteoarthritis, left shoulder: Secondary | ICD-10-CM | POA: Diagnosis not present

## 2016-07-18 ENCOUNTER — Ambulatory Visit (INDEPENDENT_AMBULATORY_CARE_PROVIDER_SITE_OTHER): Payer: Medicare Other | Admitting: Cardiology

## 2016-07-18 ENCOUNTER — Encounter: Payer: Self-pay | Admitting: Cardiology

## 2016-07-18 VITALS — BP 118/80 | HR 76

## 2016-07-18 DIAGNOSIS — M109 Gout, unspecified: Secondary | ICD-10-CM | POA: Diagnosis not present

## 2016-07-18 DIAGNOSIS — G4733 Obstructive sleep apnea (adult) (pediatric): Secondary | ICD-10-CM | POA: Diagnosis not present

## 2016-07-18 DIAGNOSIS — Z9989 Dependence on other enabling machines and devices: Secondary | ICD-10-CM

## 2016-07-18 DIAGNOSIS — Z7189 Other specified counseling: Secondary | ICD-10-CM | POA: Diagnosis not present

## 2016-07-18 NOTE — Patient Instructions (Signed)
Medication Instructions:  Your physician recommends that you continue on your current medications as directed. Please refer to the Current Medication list given to you today.   Labwork: -None  Testing/Procedures: -None  Follow-Up: Your physician wants you to follow-up in: 1 year with Dr. Radford Pax.  You will receive a reminder letter in the mail two months in advance. If you don't receive a letter, please call our office to schedule the follow-up appointment.   Any Other Special Instructions Will Be Listed Below (If Applicable).  Cpap to 12 cm, H20 report download in 4 weeks.    If you need a refill on your cardiac medications before your next appointment, please call your pharmacy.

## 2016-07-18 NOTE — Progress Notes (Addendum)
Cardiology Office Note    Date:  09/16/2020   ID:  Zachary Alvarado., DOB 1951-02-05, MRN [id number]  PCP:  Lujean Amel, MD  Cardiologist:  Fransico Him, MD   Chief Complaint  Patient presents with  . Sleep Apnea    History of Present Illness:  Zachary Alvarado. is a 66 y.o. male  with a history of OSA who presents today for followup of his OSA. He is doing well today. He tolerates his CPAP device well. He tolerate the full face mask but is having problems with the mask rubbing on the bridge of his nose.  He also feels the pressure is too high.  He says that sometimes he feels that pressure is too high and he is getting air in his stomach. He feels for the most part rested when he gets up in the am and has minimal  daytime sleepiness. He has some mouth dryness and uses Biotene.  He has no significant nasal congestion.     Past Medical History:  Diagnosis Date  . Adenomatous colon polyp    Dr Watt Climes 2004  . Anxiety    social   . Arthritis   . Depression    at one point in his life  . GERD (gastroesophageal reflux disease)    occasionally, take OTC meds x 14 days  . Gout of big toe    in right foot  . Hemorrhoid   . Hypercholesterolemia   . Kidney stones, calcium oxalate   . OSA on CPAP   . Ruptured lumbar disc 2017   treated with - epidural injection, , treated with Dr. Rolena Infante & Ramos  . Seasonal allergies   . Wears glasses     Past Surgical History:  Procedure Laterality Date  . COLONOSCOPY    . ETHMOIDECTOMY N/A 04/03/2013   Procedure: ETHMOIDECTOMY;  Surgeon: Izora Gala, MD;  Location: [place];  Service: ENT;  Laterality: N/A;  . FINGER TENDON REPAIR  2008   and arteries-lt middle finger  . KIDNEY STONE SURGERY  1996   calcium oxalate kidney stone  . MASS EXCISION Left 09/10/2018   Procedure: Left long finger debridement and drainage, mass removal;  Surgeon: Iran Planas, MD;  Location: [place];  Service:  Orthopedics;  Laterality: Left;  62min  . NASAL SINUS SURGERY  1996  . POLYPECTOMY Bilateral 04/03/2013   Procedure: BILATERAL NASAL POLYPECTOMY ;  Surgeon: Izora Gala, MD;  Location: [place];  Service: ENT;  Laterality: Bilateral;  . TOTAL SHOULDER ARTHROPLASTY Left 03/09/2016   Procedure: LEFT TOTAL SHOULDER ARTHROPLASTY;  Surgeon: Netta Cedars, MD;  Location: [place];  Service: Orthopedics;  Laterality: Left;    Current Medications: Current Meds  Medication Sig  . aspirin 81 MG tablet Take 81 mg by mouth daily.  Marland Kitchen azelastine (ASTELIN) 137 MCG/SPRAY nasal spray Place 2 sprays into both nostrils 2 (two) times daily as needed for allergies. Use in each nostril as directed  . carisoprodol (SOMA) 350 MG tablet Take 350 mg by mouth daily as needed for muscle spasms. Take as directed  . colchicine 0.6 MG tablet Take 0.6 mg by mouth 2 (two) times daily as needed (gout flare up). Gout flare up  . diclofenac sodium (VOLTAREN) 1 % GEL Apply 2 g topically daily as needed (pain).  . [DISCONTINUED] glucosamine-chondroitin 500-400 MG tablet Take 1 tablet by mouth daily as needed.   . [DISCONTINUED] HYDROcodone-acetaminophen (NORCO) 5-325 MG tablet Take  1 tablet by mouth every 6 (six) hours as needed for moderate pain.  . [DISCONTINUED] psyllium (METAMUCIL) 58.6 % powder Take 1 packet by mouth daily as needed (pt said he was taking it for cholestrol control). As neded for constipation  . [DISCONTINUED] triamcinolone cream (KENALOG) 0.1 % Apply 1 application topically 2 (two) times daily as needed (rash -- in addition to lotion).    Allergies:   Wasp venom, Percocet [oxycodone-acetaminophen], and Penicillins   Social History   Socioeconomic History  . Marital status: Married    Spouse name: Not on file  . Number of children: Not on file  . Years of education: Not on file  . Highest education level: Not on file  Occupational History  . Occupation: self employed    Employer:  [employer]  Tobacco Use  . Smoking status: Never Smoker  . Smokeless tobacco: Never Used  Vaping Use  . Vaping Use: Never used  Substance and Sexual Activity  . Alcohol use: Yes    Comment: 2 drinks daily - lessening intake in past 2 mths.   . Drug use: No  . Sexual activity: Not on file  Other Topics Concern  . Not on file  Social History Narrative   Alcohol : yes   Caffeine : yes 1 serving daily,coffee,soda,    No recreational drug use      Self employed   Children -2 girls               Social Determinants of Radio broadcast assistant Strain: Not on file  Food Insecurity: Not on file  Transportation Needs: Not on file  Physical Activity: Not on file  Stress: Not on file  Social Connections: Not on file     Family History:  The patient's family history includes Alzheimer's disease in his father; Hypertension in his sister; Leukemia in his mother.   ROS:   Please see the history of present illness.    ROS All other systems reviewed and are negative.  No flowsheet data found.     PHYSICAL EXAM:   VS:  BP 118/80   Pulse 76    GEN: Well nourished, well developed, in no acute distress  HEENT: normal  Neck: no JVD, carotid bruits, or masses Cardiac: RRR; no murmurs, rubs, or gallops,no edema.  Intact distal pulses bilaterally.  Respiratory:  clear to auscultation bilaterally, normal work of breathing GI: soft, nontender, nondistended, + BS MS: no deformity or atrophy  Skin: warm and dry, no rash Neuro:  Alert and Oriented x 3, Strength and sensation are intact Psych: euthymic mood, full affect  Wt Readings from Last 3 Encounters:  09/07/20 179 lb 3.2 oz (81.3 kg)  09/14/19 180 lb (81.6 kg)  09/12/18 179 lb (81.2 kg)      Studies/Labs Reviewed:   EKG:  EKG is not ordered today.    Recent Labs: No results found for requested labs within last 8760 hours.   Lipid Panel No results found for: CHOL, TRIG, HDL, CHOLHDL, VLDL, LDLCALC,  LDLDIRECT  Additional studies/ records that were reviewed today include:  CPAP download    ASSESSMENT:    1. OSA on CPAP      PLAN:  In order of problems listed above:  OSA - the patient is tolerating PAP therapy well without any problems. The PAP download was reviewed today and showed an AHI of 3.7/hr on 13 cm H2O with 63% compliance in using more than 4 hours nightly.  The patient has been using and benefiting from CPAP use and will continue to benefit from therapy. I have recommended that he make an appt with [facility] to get an evaluation of the mask.  His old mask with his old machine he thinks was much better.  I am going to decrease his CPAP to 12cm H2O and get a D/L in 4 weeks. I encouraged him to increase his compliance as his insurance will not pay for it.      Medication Adjustments/Labs and Tests Ordered: Current medicines are reviewed at length with the patient today.  Concerns regarding medicines are outlined above.  Medication changes, Labs and Tests ordered today are listed in the Patient Instructions below.  Patient Instructions  Medication Instructions:  Your physician recommends that you continue on your current medications as directed. Please refer to the Current Medication list given to you today.   Labwork: -None  Testing/Procedures: -None  Follow-Up: Your physician wants you to follow-up in: 1 year with Dr. Radford Pax.  You will receive a reminder letter in the mail two months in advance. If you don't receive a letter, please call our office to schedule the follow-up appointment.   Any Other Special Instructions Will Be Listed Below (If Applicable).  Cpap to 12 cm, H20 report download in 4 weeks.    If you need a refill on your cardiac medications before your next appointment, please call your pharmacy.      Signed, Fransico Him, MD  09/16/2020 10:51 AM    [facility] [place], [place], [place]  [address] Phone: [phone]; Fax: [phone]

## 2016-07-24 DIAGNOSIS — M19012 Primary osteoarthritis, left shoulder: Secondary | ICD-10-CM | POA: Diagnosis not present

## 2016-07-26 ENCOUNTER — Telehealth: Payer: Self-pay | Admitting: *Deleted

## 2016-07-26 DIAGNOSIS — G4733 Obstructive sleep apnea (adult) (pediatric): Secondary | ICD-10-CM

## 2016-07-26 NOTE — Telephone Encounter (Signed)
AIR TOUCH CUSHION ORDERED

## 2016-07-31 DIAGNOSIS — M19012 Primary osteoarthritis, left shoulder: Secondary | ICD-10-CM | POA: Diagnosis not present

## 2016-08-07 DIAGNOSIS — M19012 Primary osteoarthritis, left shoulder: Secondary | ICD-10-CM | POA: Diagnosis not present

## 2016-08-14 DIAGNOSIS — M19012 Primary osteoarthritis, left shoulder: Secondary | ICD-10-CM | POA: Diagnosis not present

## 2016-08-21 DIAGNOSIS — M19012 Primary osteoarthritis, left shoulder: Secondary | ICD-10-CM | POA: Diagnosis not present

## 2016-08-30 ENCOUNTER — Telehealth: Payer: Self-pay | Admitting: *Deleted

## 2016-08-30 DIAGNOSIS — Z9989 Dependence on other enabling machines and devices: Principal | ICD-10-CM

## 2016-08-30 DIAGNOSIS — G4733 Obstructive sleep apnea (adult) (pediatric): Secondary | ICD-10-CM

## 2016-08-30 NOTE — Telephone Encounter (Signed)
Informed the patient of his results and he verbalized understanding. He informed me he was out of the [place] and will return on April 27th. He understands an order for a 2 week autotitration has been placed to [facility] from 4 to 18 cm H20. He agrees with the change and thanked me for calling.

## 2016-08-30 NOTE — Telephone Encounter (Signed)
-----   Message from Sueanne Margarita, MD sent at 08/24/2016 11:51 AM EDT ----- AHI too high - please get a 2 week autotitration from 4 to 18cm H2O.

## 2016-09-11 DIAGNOSIS — Z471 Aftercare following joint replacement surgery: Secondary | ICD-10-CM | POA: Diagnosis not present

## 2016-09-11 DIAGNOSIS — Z96612 Presence of left artificial shoulder joint: Secondary | ICD-10-CM | POA: Diagnosis not present

## 2016-09-11 DIAGNOSIS — M19012 Primary osteoarthritis, left shoulder: Secondary | ICD-10-CM | POA: Diagnosis not present

## 2016-10-02 ENCOUNTER — Encounter: Payer: Self-pay | Admitting: Cardiology

## 2016-10-05 ENCOUNTER — Telehealth: Payer: Self-pay | Admitting: *Deleted

## 2016-10-05 DIAGNOSIS — G4733 Obstructive sleep apnea (adult) (pediatric): Secondary | ICD-10-CM

## 2016-10-05 NOTE — Telephone Encounter (Signed)
Notes recorded by Freada Bergeron, CMA on 10/05/2016 at 12:24 PM EDT Informed patient of recommended pressure changes made by Dr. Radford Pax and understanding was verbalized. Patient states he really liked the auto titration and he would like to switch out his machine for a self adjusting cpap instead of the pressure change. Patient states the self adjusting one works well for him and he loved that it would adjust as he needed it.  Patient states unless there is a reason you know of why he should not switch his machines out he would like for you to recommend a self adjusting one for him through [facility]. Patient states if there is a reason you know of he would like to discuss that with you. ------  Notes recorded by Freada Bergeron, CMA on 10/05/2016 at 10:27 AM EDT LMTCB ------  Notes recorded by Sueanne Margarita, MD on 10/04/2016 at 2:15 PM EDT Increase CPAP to 13cm H2O and get a download in 4 weeks

## 2016-10-08 ENCOUNTER — Encounter: Payer: Self-pay | Admitting: Cardiology

## 2016-10-08 NOTE — Telephone Encounter (Signed)
OK to change to auto CPAP with pressure 5 to 18cmH2O and get a download in 4 weeks

## 2016-10-11 DIAGNOSIS — H2513 Age-related nuclear cataract, bilateral: Secondary | ICD-10-CM | POA: Diagnosis not present

## 2016-10-11 DIAGNOSIS — H5213 Myopia, bilateral: Secondary | ICD-10-CM | POA: Diagnosis not present

## 2016-10-11 DIAGNOSIS — H43812 Vitreous degeneration, left eye: Secondary | ICD-10-CM | POA: Diagnosis not present

## 2016-10-11 DIAGNOSIS — H25013 Cortical age-related cataract, bilateral: Secondary | ICD-10-CM | POA: Diagnosis not present

## 2016-10-18 DIAGNOSIS — E7800 Pure hypercholesterolemia, unspecified: Secondary | ICD-10-CM | POA: Diagnosis not present

## 2016-10-18 DIAGNOSIS — M109 Gout, unspecified: Secondary | ICD-10-CM | POA: Diagnosis not present

## 2016-10-18 DIAGNOSIS — Z79899 Other long term (current) drug therapy: Secondary | ICD-10-CM | POA: Diagnosis not present

## 2016-10-18 DIAGNOSIS — Z1159 Encounter for screening for other viral diseases: Secondary | ICD-10-CM | POA: Diagnosis not present

## 2016-10-18 DIAGNOSIS — Z125 Encounter for screening for malignant neoplasm of prostate: Secondary | ICD-10-CM | POA: Diagnosis not present

## 2016-10-18 DIAGNOSIS — Z0000 Encounter for general adult medical examination without abnormal findings: Secondary | ICD-10-CM | POA: Diagnosis not present

## 2016-10-18 DIAGNOSIS — Z23 Encounter for immunization: Secondary | ICD-10-CM | POA: Diagnosis not present

## 2016-10-30 ENCOUNTER — Telehealth: Payer: Self-pay | Admitting: *Deleted

## 2016-10-30 NOTE — Telephone Encounter (Signed)
Go forward with prior order for CPAP at 13cm H2O and repeat D/L in 4 weeks

## 2016-10-30 NOTE — Telephone Encounter (Addendum)
Barbaraann Rondo [facility]) called to say the patient has a single set unit and they cannot provide the pressure ordered to the current units capability. Per Barbaraann Rondo the patient is not eligible for another unit for 5 years.  Barbaraann Rondo states the original order was not for an auto cpap so they are unable to honor this order at this time. [facility] has notified the patient.

## 2016-10-30 NOTE — Addendum Note (Signed)
Addended by: Freada Bergeron on: 10/30/2016 06:02 PM   Modules accepted: Orders

## 2016-10-30 NOTE — Telephone Encounter (Addendum)
Late entry: Order for auto cpap was sent on 10/09/16 to [facility].  Patient called today to inquire about his auto cpap with pressure set from 5 fo 18 cm ordered Per Dr Radford Pax on 10/09/16. Informed patient that order was faxed to [facility] on 5/22 but the auto cpap was not ordered because the patient had just received a new cpap in December 2017. [name]) is working on Catering manager for the patient and [facility] will call the patient to follow up and get a download in 4 weeks.

## 2016-11-07 NOTE — Telephone Encounter (Signed)
4 week download printed, sent to scan

## 2016-11-08 DIAGNOSIS — R972 Elevated prostate specific antigen [PSA]: Secondary | ICD-10-CM | POA: Diagnosis not present

## 2016-11-08 DIAGNOSIS — R7301 Impaired fasting glucose: Secondary | ICD-10-CM | POA: Diagnosis not present

## 2016-11-15 NOTE — Telephone Encounter (Signed)
-----   Message from Sueanne Margarita, MD sent at 11/10/2016  7:46 PM EDT ----- Good AHI and compliance.  Continue current CPAP settings.

## 2016-11-15 NOTE — Telephone Encounter (Signed)
Informed patient of compliance results and he verbalized understanding. Patient understands his settings will not change. Patient was grateful for the call and thanked me.

## 2016-11-26 DIAGNOSIS — R0781 Pleurodynia: Secondary | ICD-10-CM | POA: Diagnosis not present

## 2016-12-07 DIAGNOSIS — H43812 Vitreous degeneration, left eye: Secondary | ICD-10-CM | POA: Diagnosis not present

## 2017-02-12 DIAGNOSIS — L814 Other melanin hyperpigmentation: Secondary | ICD-10-CM | POA: Diagnosis not present

## 2017-02-12 DIAGNOSIS — R202 Paresthesia of skin: Secondary | ICD-10-CM | POA: Diagnosis not present

## 2017-02-12 DIAGNOSIS — D1801 Hemangioma of skin and subcutaneous tissue: Secondary | ICD-10-CM | POA: Diagnosis not present

## 2017-02-12 DIAGNOSIS — Z85828 Personal history of other malignant neoplasm of skin: Secondary | ICD-10-CM | POA: Diagnosis not present

## 2017-02-12 DIAGNOSIS — L821 Other seborrheic keratosis: Secondary | ICD-10-CM | POA: Diagnosis not present

## 2017-02-12 DIAGNOSIS — Z23 Encounter for immunization: Secondary | ICD-10-CM | POA: Diagnosis not present

## 2017-02-12 DIAGNOSIS — L570 Actinic keratosis: Secondary | ICD-10-CM | POA: Diagnosis not present

## 2017-02-12 DIAGNOSIS — D225 Melanocytic nevi of trunk: Secondary | ICD-10-CM | POA: Diagnosis not present

## 2017-02-15 DIAGNOSIS — Z23 Encounter for immunization: Secondary | ICD-10-CM | POA: Diagnosis not present

## 2017-02-15 DIAGNOSIS — R972 Elevated prostate specific antigen [PSA]: Secondary | ICD-10-CM | POA: Diagnosis not present

## 2017-06-27 IMAGING — CR DG SHOULDER 1V*L*
1 series · 1 of 1 positions shown · non-contrast
Comparison: No recent prior.

CLINICAL DATA: Left shoulder replacement.

EXAM:
LEFT SHOULDER - 1 VIEW

[AP]
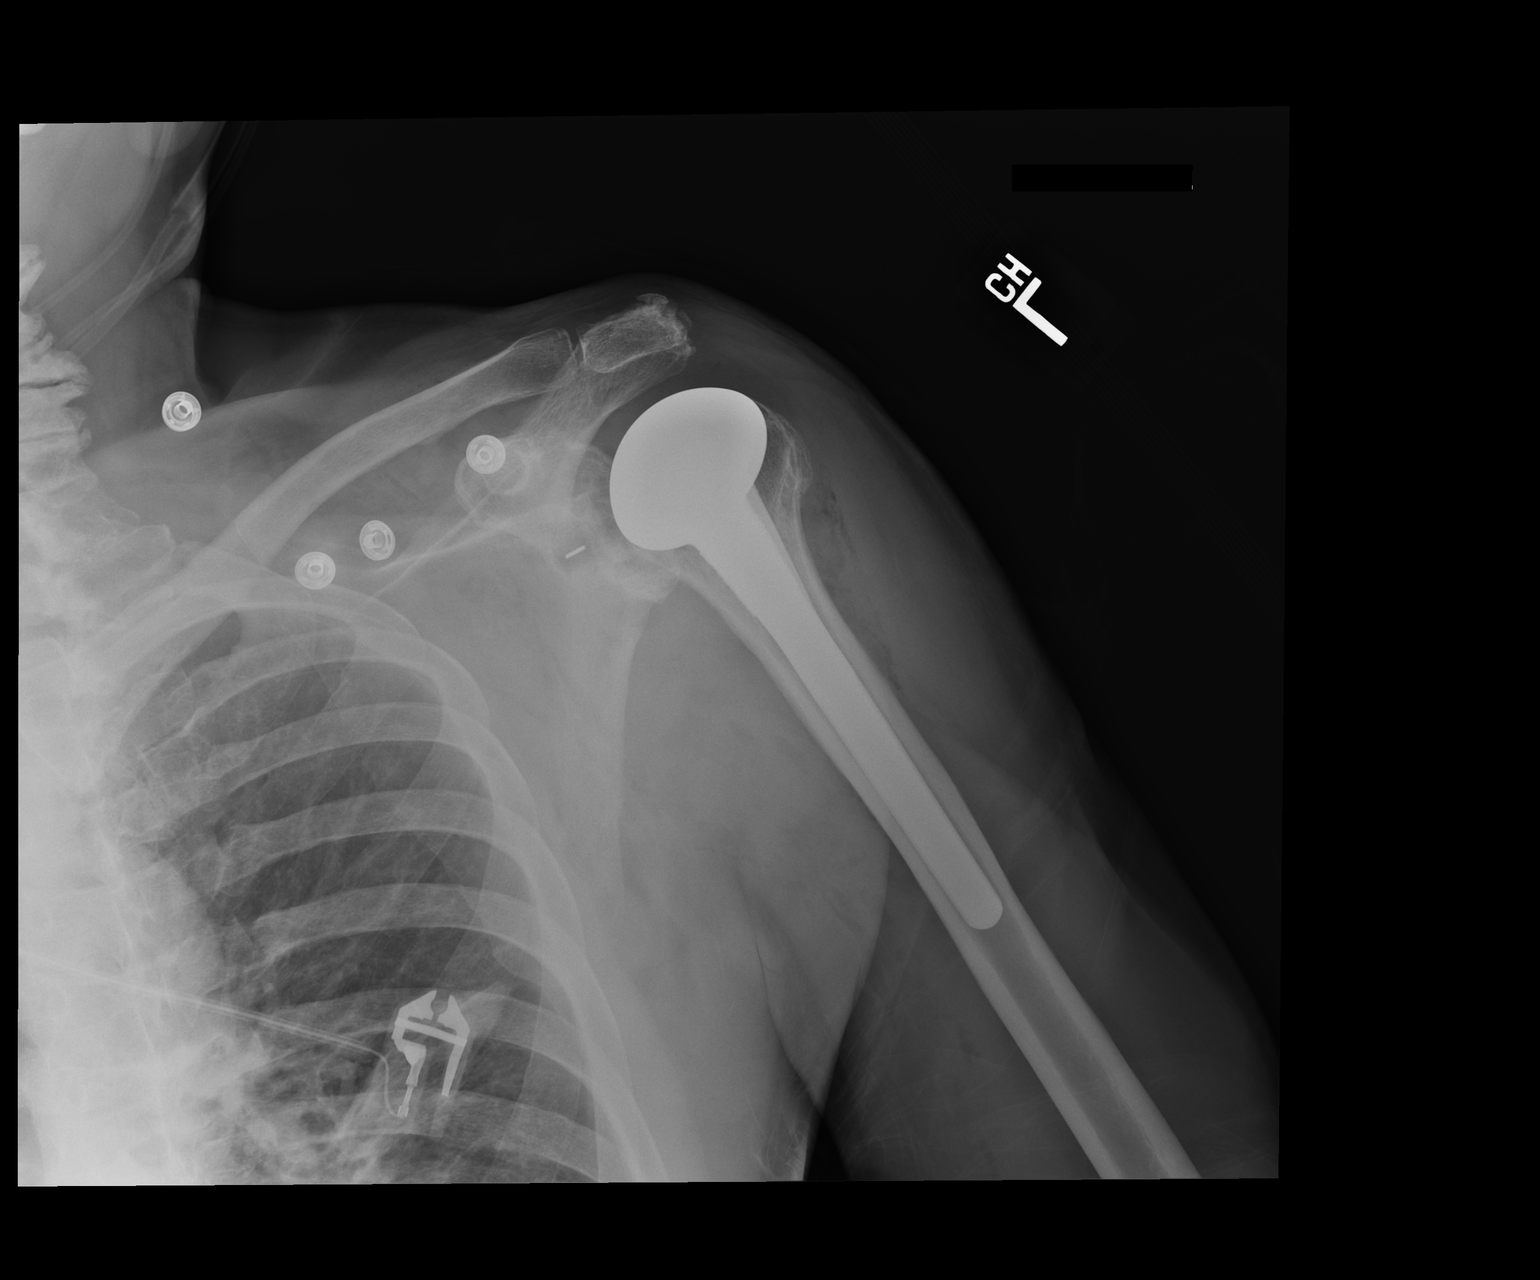

[1 of 1 positions shown; findings below may reference images not displayed]

FINDINGS: Total left shoulder replacement. Hardware intact. Good anatomic
alignment. No acute bony abnormality identified. Atelectatic changes
left lung base .
IMPRESSION: Total left shoulder replacement. Hardware intact. Good anatomic
alignment .

## 2017-07-08 DIAGNOSIS — G4733 Obstructive sleep apnea (adult) (pediatric): Secondary | ICD-10-CM | POA: Diagnosis not present

## 2017-07-17 DIAGNOSIS — H9191 Unspecified hearing loss, right ear: Secondary | ICD-10-CM | POA: Diagnosis not present

## 2017-07-17 DIAGNOSIS — H9042 Sensorineural hearing loss, unilateral, left ear, with unrestricted hearing on the contralateral side: Secondary | ICD-10-CM | POA: Diagnosis not present

## 2017-07-17 DIAGNOSIS — H9113 Presbycusis, bilateral: Secondary | ICD-10-CM | POA: Diagnosis not present

## 2017-07-17 DIAGNOSIS — H918X3 Other specified hearing loss, bilateral: Secondary | ICD-10-CM | POA: Diagnosis not present

## 2017-08-29 ENCOUNTER — Telehealth: Payer: Self-pay | Admitting: Cardiology

## 2017-08-29 ENCOUNTER — Encounter: Payer: Self-pay | Admitting: Cardiology

## 2017-08-29 ENCOUNTER — Ambulatory Visit: Payer: PPO | Admitting: Cardiology

## 2017-08-29 VITALS — BP 126/58 | HR 81 | Ht 69.0 in | Wt 183.2 lb

## 2017-08-29 DIAGNOSIS — Z9989 Dependence on other enabling machines and devices: Secondary | ICD-10-CM | POA: Diagnosis not present

## 2017-08-29 DIAGNOSIS — G4733 Obstructive sleep apnea (adult) (pediatric): Secondary | ICD-10-CM

## 2017-08-29 NOTE — Telephone Encounter (Signed)
New message    Patient states that height is wrong on his chart , he is 5'9 instead of 5'8 .  Please fix this !

## 2017-08-29 NOTE — Telephone Encounter (Signed)
Patient height updated to 5'9" in patient's chart per patient request.

## 2017-08-29 NOTE — Patient Instructions (Signed)
Medication Instructions:  Your physician recommends that you continue on your current medications as directed. Please refer to the Current Medication list given to you today.  If you need a refill on your cardiac medications, please contact your pharmacy first.  Labwork: None ordered   Testing/Procedures: None ordered   Follow-Up: Your physician wants you to follow-up in: 1 year with Dr. Turner. You will receive a reminder letter in the mail two months in advance. If you don't receive a letter, please call our office to schedule the follow-up appointment.  Any Other Special Instructions Will Be Listed Below (If Applicable).   Thank you for choosing [facility]    Rena Layani Foronda, RN  [phone]  If you need a refill on your cardiac medications before your next appointment, please call your pharmacy.   

## 2017-08-29 NOTE — Progress Notes (Addendum)
Cardiology Office Note:    Date:  09/16/2020   ID:  Zachary Pelt., DOB 07-15-1950, MRN [id number]  PCP:  Lujean Amel, MD  Cardiologist:  Fransico Him, MD    Referring MD: Lujean Amel, MD   Chief Complaint  Patient presents with  . Sleep Apnea    History of Present Illness:    Zachary Sondgeroth. is a 67 y.o. male with a hx of OSA on CPAP.  He is doing well with his CPAP device.  He tolerates the mask and feels the pressure is adequate.  Since going on CPAP he feels rested in the am and has no significant daytime sleepiness.  He denies any significant mouth or nasal dryness or nasal congestion.  He does not think that he snores.     Past Medical History:  Diagnosis Date  . Adenomatous colon polyp    Dr Watt Climes 2004  . Anxiety    social   . Arthritis   . Depression    at one point in his life  . GERD (gastroesophageal reflux disease)    occasionally, take OTC meds x 14 days  . Gout of big toe    in right foot  . Hemorrhoid   . Hypercholesterolemia   . Kidney stones, calcium oxalate   . OSA on CPAP   . Ruptured lumbar disc 2017   treated with - epidural injection, , treated with Dr. [place] Infante & Ramos  . Seasonal allergies   . Wears glasses     Past Surgical History:  Procedure Laterality Date  . COLONOSCOPY    . ETHMOIDECTOMY N/A 04/03/2013   Procedure: ETHMOIDECTOMY;  Surgeon: Izora Gala, MD;  Location: [place];  Service: ENT;  Laterality: N/A;  . FINGER TENDON REPAIR  2008   and arteries-lt middle finger  . KIDNEY STONE SURGERY  1996   calcium oxalate kidney stone  . MASS EXCISION Left 09/10/2018   Procedure: Left long finger debridement and drainage, mass removal;  Surgeon: Iran Planas, MD;  Location: [place];  Service: Orthopedics;  Laterality: Left;  64min  . NASAL SINUS SURGERY  1996  . POLYPECTOMY Bilateral 04/03/2013   Procedure: BILATERAL NASAL POLYPECTOMY ;  Surgeon: Izora Gala, MD;  Location: [place];  Service: ENT;  Laterality: Bilateral;  . TOTAL SHOULDER ARTHROPLASTY Left 03/09/2016   Procedure: LEFT TOTAL SHOULDER ARTHROPLASTY;  Surgeon: Netta Cedars, MD;  Location: [place];  Service: Orthopedics;  Laterality: Left;    Current Medications: Current Meds  Medication Sig  . aspirin 81 MG tablet Take 81 mg by mouth daily.  Marland Kitchen azelastine (ASTELIN) 137 MCG/SPRAY nasal spray Place 2 sprays into both nostrils 2 (two) times daily as needed for allergies. Use in each nostril as directed  . carisoprodol (SOMA) 350 MG tablet Take 350 mg by mouth daily as needed for muscle spasms. Take as directed  . colchicine 0.6 MG tablet Take 0.6 mg by mouth 2 (two) times daily as needed (gout flare up). Gout flare up  . diclofenac sodium (VOLTAREN) 1 % GEL Apply 2 g topically daily as needed (pain).  . [DISCONTINUED] glucosamine-chondroitin 500-400 MG tablet Take 1 tablet by mouth daily as needed.   . [DISCONTINUED] HYDROcodone-acetaminophen (NORCO) 5-325 MG tablet Take 1 tablet by mouth every 6 (six) hours as needed for moderate pain.  . [DISCONTINUED] NON FORMULARY CPAP Machine as directed  . [DISCONTINUED] psyllium (METAMUCIL) 58.6 % powder Take 1 packet by mouth daily  as needed (pt said he was taking it for cholestrol control). As neded for constipation  . [DISCONTINUED] triamcinolone cream (KENALOG) 0.1 % Apply 1 application topically 2 (two) times daily as needed (rash -- in addition to lotion).     Allergies:   Wasp venom, Percocet [oxycodone-acetaminophen], and Penicillins   Social History   Socioeconomic History  . Marital status: Married    Spouse name: Not on file  . Number of children: Not on file  . Years of education: Not on file  . Highest education level: Not on file  Occupational History  . Occupation: self employed    Employer: [employer]  Tobacco Use  . Smoking status: Never Smoker  . Smokeless tobacco: Never Used  Vaping Use  . Vaping Use: Never used   Substance and Sexual Activity  . Alcohol use: Yes    Comment: 2 drinks daily - lessening intake in past 2 mths.   . Drug use: No  . Sexual activity: Not on file  Other Topics Concern  . Not on file  Social History Narrative   Alcohol : yes   Caffeine : yes 1 serving daily,coffee,soda,    No recreational drug use      Self employed   Children -2 girls               Social Determinants of Radio broadcast assistant Strain: Not on file  Food Insecurity: Not on file  Transportation Needs: Not on file  Physical Activity: Not on file  Stress: Not on file  Social Connections: Not on file     Family History: The patient's family history includes Alzheimer's disease in his father; Hypertension in his sister; Leukemia in his mother.  ROS:   Please see the history of present illness.    ROS  All other systems reviewed and negative.   EKGs/Labs/Other Studies Reviewed:    The following studies were reviewed today: PAP download  EKG:  EKG is not ordered today.   Recent Labs: No results found for requested labs within last 8760 hours.   Recent Lipid Panel No results found for: CHOL, TRIG, HDL, CHOLHDL, VLDL, LDLCALC, LDLDIRECT  Physical Exam:    VS:  BP (!) 126/58   Pulse 81   Ht 5\' 9"  (1.753 m)   Wt 183 lb 3.2 oz (83.1 kg)   SpO2 96%   BMI 27.05 kg/m     Wt Readings from Last 3 Encounters:  09/07/20 179 lb 3.2 oz (81.3 kg)  09/14/19 180 lb (81.6 kg)  09/12/18 179 lb (81.2 kg)     GEN:  Well nourished, well developed in no acute distress HEENT: Normal NECK: No JVD; No carotid bruits LYMPHATICS: No lymphadenopathy CARDIAC: RRR, no murmurs, rubs, gallops RESPIRATORY:  Clear to auscultation without rales, wheezing or rhonchi  ABDOMEN: Soft, non-tender, non-distended MUSCULOSKELETAL:  No edema; No deformity  SKIN: Warm and dry NEUROLOGIC:  Alert and oriented x 3 PSYCHIATRIC:  Normal affect   ASSESSMENT:    1. OSA on CPAP    PLAN:    In order of  problems listed above:  1.  OSA - the patient is tolerating PAP therapy well without any problems. The PAP download was reviewed today and showed an AHI of 3.9/hr on 12 cm H2O with 70% compliance in using more than 4 hours nightly.  The patient has been using and benefiting from PAP use and will continue to benefit from therapy.    Medication Adjustments/Labs and  Tests Ordered: Current medicines are reviewed at length with the patient today.  Concerns regarding medicines are outlined above.  No orders of the defined types were placed in this encounter.  No orders of the defined types were placed in this encounter.   Signed, Fransico Him, MD  09/16/2020 10:50 AM    [place]

## 2017-10-22 DIAGNOSIS — Z0001 Encounter for general adult medical examination with abnormal findings: Secondary | ICD-10-CM | POA: Diagnosis not present

## 2017-10-22 DIAGNOSIS — E7800 Pure hypercholesterolemia, unspecified: Secondary | ICD-10-CM | POA: Diagnosis not present

## 2017-10-22 DIAGNOSIS — N529 Male erectile dysfunction, unspecified: Secondary | ICD-10-CM | POA: Diagnosis not present

## 2017-10-22 DIAGNOSIS — M109 Gout, unspecified: Secondary | ICD-10-CM | POA: Diagnosis not present

## 2017-10-22 DIAGNOSIS — Z79899 Other long term (current) drug therapy: Secondary | ICD-10-CM | POA: Diagnosis not present

## 2017-10-22 DIAGNOSIS — R7301 Impaired fasting glucose: Secondary | ICD-10-CM | POA: Diagnosis not present

## 2017-10-22 DIAGNOSIS — Z125 Encounter for screening for malignant neoplasm of prostate: Secondary | ICD-10-CM | POA: Diagnosis not present

## 2017-10-22 DIAGNOSIS — Z23 Encounter for immunization: Secondary | ICD-10-CM | POA: Diagnosis not present

## 2017-11-08 DIAGNOSIS — G4733 Obstructive sleep apnea (adult) (pediatric): Secondary | ICD-10-CM | POA: Diagnosis not present

## 2018-01-24 DIAGNOSIS — Z23 Encounter for immunization: Secondary | ICD-10-CM | POA: Diagnosis not present

## 2018-04-14 DIAGNOSIS — Z85828 Personal history of other malignant neoplasm of skin: Secondary | ICD-10-CM | POA: Diagnosis not present

## 2018-04-14 DIAGNOSIS — L570 Actinic keratosis: Secondary | ICD-10-CM | POA: Diagnosis not present

## 2018-04-14 DIAGNOSIS — L821 Other seborrheic keratosis: Secondary | ICD-10-CM | POA: Diagnosis not present

## 2018-04-14 DIAGNOSIS — L814 Other melanin hyperpigmentation: Secondary | ICD-10-CM | POA: Diagnosis not present

## 2018-04-14 DIAGNOSIS — Z23 Encounter for immunization: Secondary | ICD-10-CM | POA: Diagnosis not present

## 2018-04-14 DIAGNOSIS — D225 Melanocytic nevi of trunk: Secondary | ICD-10-CM | POA: Diagnosis not present

## 2018-05-12 DIAGNOSIS — G4733 Obstructive sleep apnea (adult) (pediatric): Secondary | ICD-10-CM | POA: Diagnosis not present

## 2018-06-10 ENCOUNTER — Telehealth: Payer: Self-pay | Admitting: Internal Medicine

## 2018-09-01 DIAGNOSIS — M79645 Pain in left finger(s): Secondary | ICD-10-CM | POA: Diagnosis not present

## 2018-09-01 DIAGNOSIS — M109 Gout, unspecified: Secondary | ICD-10-CM | POA: Diagnosis not present

## 2018-09-01 DIAGNOSIS — R922 Inconclusive mammogram: Secondary | ICD-10-CM | POA: Diagnosis not present

## 2018-09-04 ENCOUNTER — Other Ambulatory Visit: Payer: Self-pay

## 2018-09-04 ENCOUNTER — Encounter (HOSPITAL_BASED_OUTPATIENT_CLINIC_OR_DEPARTMENT_OTHER): Payer: Self-pay | Admitting: *Deleted

## 2018-09-04 DIAGNOSIS — M79645 Pain in left finger(s): Secondary | ICD-10-CM | POA: Diagnosis not present

## 2018-09-04 DIAGNOSIS — M109 Gout, unspecified: Secondary | ICD-10-CM | POA: Diagnosis not present

## 2018-09-04 NOTE — H&P (Signed)
Zachary Alvarado is an 68 y.o. male.   Chief Complaint: LEFT LONG FINGER REDNESS AND SWELLING  HPI: The patient is a 68 y/o male who has had swelling, redness, warmth and stiffness of the left long finger for several days without any known injury. 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 He was started on Doxycycline and colcrys but has not had any improvement in symptoms. Discussed the reason and rationale for surgery.  The patient is here today for surgery.  He denies chest pain, shortness of breath, fever, chills, nausea, vomiting, or diarrhea.    Past Medical History:  Diagnosis Date  . Adenomatous colon polyp    Dr Watt Climes 2004  . Anxiety    social   . Arthritis   . Chronic kidney disease   . Depression    at one point in his life  . GERD (gastroesophageal reflux disease)    occasionally, take OTC meds x 14 days  . Gout of big toe    in right foot  . Hemorrhoid   . Hypercholesterolemia   . Kidney stones, calcium oxalate   . OSA on CPAP    withAHI 2/hr-titrated to 13cm H2O  . OSA on CPAP    tested more than 5-6 yrs ago ([place] off of [place])  . Ruptured lumbar disc 2017   treated with - epidural injection, , treated with Dr. [place] Infante & Ramos  . Seasonal allergies   . Wears glasses     Past Surgical History:  Procedure Laterality Date  . COLONOSCOPY    . ETHMOIDECTOMY N/A 04/03/2013   Procedure: ETHMOIDECTOMY;  Surgeon: Izora Gala, MD;  Location: [place];  Service: ENT;  Laterality: N/A;  . FINGER TENDON REPAIR  2008   and arteries-lt middle finger  . KIDNEY STONE SURGERY  1996   calcium oxalate kidney stone  . NASAL SINUS SURGERY  1996  . POLYPECTOMY Bilateral 04/03/2013   Procedure: BILATERAL NASAL POLYPECTOMY ;  Surgeon: Izora Gala, MD;  Location: [place];  Service: ENT;  Laterality: Bilateral;  . TOTAL SHOULDER ARTHROPLASTY Left 03/09/2016   Procedure: LEFT TOTAL SHOULDER ARTHROPLASTY;   Surgeon: Netta Cedars, MD;  Location: [place];  Service: Orthopedics;  Laterality: Left;    Family History  Problem Relation Age of Onset  . Leukemia Mother   . Alzheimer's disease Father   . Hypertension Sister    Social History:  reports that he has never smoked. He has never used smokeless tobacco. He reports current alcohol use. He reports that he does not use drugs.  Allergies:  Allergies  Allergen Reactions  . Wasp Venom Swelling    Anything that stings will cause him to swell  . Percocet [Oxycodone-Acetaminophen] Other (See Comments)    Dizziness   . Penicillins Rash    PATIENT ABLE TO TAKE KEFLEX W/O REACTION    No medications prior to admission.    No results found for this or any previous visit (from the past 48 hour(s)). No results found.  ROS NO RECENT ILLNESSES OR HOSPITALIZATIONS  There were no vitals taken for this visit. Physical Exam  General Appearance:  Alert, cooperative, no distress, appears stated age  Head:  Normocephalic, without obvious abnormality, atraumatic  Eyes:  Pupils equal, conjunctiva/corneas clear,         Throat: Lips, mucosa, and tongue normal; teeth and gums normal  Neck: No visible masses     Lungs:   respirations  unlabored  Chest Wall:  No tenderness or deformity  Heart:  Regular rate and rhythm,  Abdomen:   Soft, non-tender,         Extremities: Left long finger open wound volarly with soft tissue mass beneath area. Does not have full dip/pip flexion Finger tip warm well perfused  Pulses: 2+ and symmetric  Skin: Skin color, texture, turgor normal, no rashes or lesions     Neurologic: Normal    Assessment LEFT LONG FINGER DRAINING WOUND, GOUT   Plan LEFT LONG FINGER DEBRIDEMENT AND DRAINAGE, MASS REMOVAL   WE ARE PLANNING SURGERY FOR YOUR UPPER EXTREMITY. THE RISKS AND BENEFITS OF SURGERY INCLUDE BUT NOT LIMITED TO BLEEDING INFECTION, DAMAGE TO NEARBY NERVES ARTERIES TENDONS, FAILURE OF SURGERY TO ACCOMPLISH ITS  INTENDED GOALS, PERSISTENT SYMPTOMS AND NEED FOR FURTHER SURGICAL INTERVENTION. WITH THIS IN MIND WE WILL PROCEED. I HAVE DISCUSSED WITH THE PATIENT THE PRE AND POSTOPERATIVE REGIMEN AND THE DOS AND DON'TS. PT VOICED UNDERSTANDING AND INFORMED CONSENT SIGNED.  R/B/A DISCUSSED WITH PT IN OFFICE.  PT VOICED UNDERSTANDING OF PLAN CONSENT SIGNED DAY OF SURGERY PT SEEN AND EXAMINED PRIOR TO OPERATIVE PROCEDURE/DAY OF SURGERY SITE MARKED. QUESTIONS ANSWERED WILL GO HOME FOLLOWING SURGERY  Iran Planas MD 09/10/18   Brynda Peon 09/04/2018, 11:32 AM

## 2018-09-08 ENCOUNTER — Encounter: Payer: Self-pay | Admitting: Cardiology

## 2018-09-08 ENCOUNTER — Telehealth: Payer: Self-pay | Admitting: Cardiology

## 2018-09-08 NOTE — Progress Notes (Signed)
This encounter was created in error - please disregard.

## 2018-09-08 NOTE — Telephone Encounter (Signed)
YOUR CARDIOLOGY TEAM HAS ARRANGED FOR AN E-VISIT FOR YOUR APPOINTMENT - PLEASE REVIEW IMPORTANT INFORMATION BELOW SEVERAL DAYS PRIOR TO YOUR APPOINTMENT  Due to the recent COVID-19 pandemic, we are transitioning in-person office visits to tele-medicine visits in an effort to decrease unnecessary exposure to our patients, their families, and staff. These visits are billed to your insurance just like a normal visit is. We also encourage you to sign up for MyChart if you have not already done so. You will need a smartphone if possible. For patients that do not have this, we can still complete the visit using a regular telephone but do prefer a smartphone to enable video when possible. You may have a family member that lives with you that can help. If possible, we also ask that you have a blood pressure cuff and scale at home to measure your blood pressure, heart rate and weight prior to your scheduled appointment. Patients with clinical needs that need an in-person evaluation and testing will still be able to come to the office if absolutely necessary. If you have any questions, feel free to call our office.    [email or web address]     YOUR PROVIDER WILL BE USING THE FOLLOWING PLATFORM TO COMPLETE YOUR VISIT:  IF USING WEBEX - How to Download the WebEx App to Your SmartPhone  - If Apple device, go to [employer] and type in WebEx in the search bar. [place] [employer], the blue/green circle. If Android, go to [employer] and type in [employer] in the search bar. The app is free but as with any other app download, your phone may require you to verify saved payment information or Apple/Android password.  - You do NOT have to create an account. - On the day of the visit, our staff will walk you through joining the meeting with the meeting number/password.   IF USING MYCHART - How to Download the MyChart App to Your SmartPhone   - If Apple, go to [employer] and type in MyChart in the search bar and download  the app. If Android, ask patient to go to [employer] and type in [place] in the search bar and download the app. The app is free but as with any other app downloads, your phone may require you to verify saved payment information or Apple/Android password.  - You will need to then log into the app with your MyChart username and password, and select [facility] as your healthcare provider to link the account. When it is time for your visit, go to the MyChart app, find appointments, and click Begin Video Visit. Be sure to Select Allow for your device to access the Microphone and Camera for your visit. You will then be connected, and your provider will be with you shortly.  **If you have any issues connecting or need assistance, please contact MyChart service desk [phone])**  **If using a computer, in order to ensure the best quality for your visit, you will need to use either of the following Internet Browsers: [employer], or Google Chrome**   IF USING DOXIMITY or [email or web address] - The staff will give you instructions on receiving your link to join the meeting the day of your visit.      2-3 DAYS BEFORE YOUR APPOINTMENT  You will receive a telephone call from one of our [facility] team members - your caller ID may say "Unknown caller." If this is a video visit, we will walk you through how  to set up your device to be able to complete the visit. We will remind you check your blood pressure, heart rate and weight prior to your scheduled appointment. If you have an Apple Watch or Kardia, please upload any pertinent ECG strips the day before or morning of your appointment to [place] [place]. Our staff will also make sure you have reviewed the consent and agree to move forward with your scheduled tele-health visit.     THE DAY OF YOUR APPOINTMENT  Approximately 15 minutes prior to your scheduled appointment, you will receive a telephone call from one of [facility] team - your caller ID may  say "Unknown caller."  Our staff will confirm medications, vital signs for the day and any symptoms you may be experiencing. Please have this information available prior to the time of visit start. It may also be helpful for you to have a pad of paper and pen handy for any instructions given during your visit. They will also walk you through joining the smartphone meeting if this is a video visit.    CONSENT FOR TELE-HEALTH VISIT - PLEASE REVIEW  I hereby voluntarily request, consent and authorize [facility] and its employed or contracted physicians, physician assistants, nurse practitioners or other licensed health care professionals (the Practitioner), to provide me with telemedicine health care services (the Services") as deemed necessary by the treating Practitioner. I acknowledge and consent to receive the Services by the Practitioner via telemedicine. I understand that the telemedicine visit will involve communicating with the Practitioner through live audiovisual communication technology and the disclosure of certain medical information by electronic transmission. I acknowledge that I have been given the opportunity to request an in-person assessment or other available alternative prior to the telemedicine visit and am voluntarily participating in the telemedicine visit.  I understand that I have the right to withhold or withdraw my consent to the use of telemedicine in the course of my care at any time, without affecting my right to future care or treatment, and that the Practitioner or I may terminate the telemedicine visit at any time. I understand that I have the right to inspect all information obtained and/or recorded in the course of the telemedicine visit and may receive copies of available information for a reasonable fee.  I understand that some of the potential risks of receiving the Services via telemedicine include:   Delay or interruption in medical evaluation due to technological  equipment failure or disruption;  Information transmitted may not be sufficient (e.g. poor resolution of images) to allow for appropriate medical decision making by the Practitioner; and/or   In rare instances, security protocols could fail, causing a breach of personal health information.  Furthermore, I acknowledge that it is my responsibility to provide information about my medical history, conditions and care that is complete and accurate to the best of my ability. I acknowledge that Practitioner's advice, recommendations, and/or decision may be based on factors not within their control, such as incomplete or inaccurate data provided by me or distortions of diagnostic images or specimens that may result from electronic transmissions. I understand that the practice of medicine is not an exact science and that Practitioner makes no warranties or guarantees regarding treatment outcomes. I acknowledge that I will receive a copy of this consent concurrently upon execution via email to the email address I last provided but may also request a printed copy by calling the office of [facility].    I understand that my insurance will be  billed for this visit.   I have read or had this consent read to me.  I understand the contents of this consent, which adequately explains the benefits and risks of the Services being provided via telemedicine.   I have been provided ample opportunity to ask questions regarding this consent and the Services and have had my questions answered to my satisfaction.  I give my informed consent for the services to be provided through the use of telemedicine in my medical care  By participating in this telemedicine visit I agree to the above.

## 2018-09-09 ENCOUNTER — Other Ambulatory Visit: Payer: Self-pay

## 2018-09-09 ENCOUNTER — Encounter: Payer: PPO | Admitting: Cardiology

## 2018-09-09 ENCOUNTER — Encounter: Payer: Self-pay | Admitting: Cardiology

## 2018-09-09 NOTE — Pre-Procedure Instructions (Signed)
Pt called and re screened for s/s of Covid-19 virus. Denies: SOB, fever, chills, head or body ache's, runny nose, sneezing or sore throat. Denies any travel out of the state in the last 3 weeks. States that no one in the home has any of the above symptoms. Instructions and arrival time reviewed. 

## 2018-09-09 NOTE — Progress Notes (Signed)
This encounter was created in error - please disregard.

## 2018-09-09 NOTE — Pre-Procedure Instructions (Addendum)
    SPOKE W/  _joseph at 1637 pm 09-09-2018     SCREENING SYMPTOMS OF COVID 19: Lm for patient to return call at 1531 pm 09-09-2018  COUGH--no  RUNNY NOSE--- no  SORE THROAT---no  NASAL CONGESTION----no  SNEEZING----no  SHORTNESS OF BREATH---no  DIFFICULTY BREATHING---no  TEMP >100.4-----no  UNEXPLAINED BODY ACHES------no   HAVE YOU OR ANY FAMILY MEMBER TRAVELLED PAST 14 DAYS OUT OF THE   COUNTY---no STATE----no COUNTRY----no  HAVE YOU OR ANY FAMILY MEMBER BEEN EXPOSED TO ANYONE WITH COVID 19? no

## 2018-09-09 NOTE — Telephone Encounter (Signed)
Duplicate encounter

## 2018-09-09 NOTE — Progress Notes (Addendum)
Virtual Visit via Video Note   This visit type was conducted due to national recommendations for restrictions regarding the COVID-19 Pandemic (e.g. social distancing) in an effort to limit this patient's exposure and mitigate transmission in our community.  Due to his co-morbid illnesses, this patient is at least at moderate risk for complications without adequate follow up.  This format is felt to be most appropriate for this patient at this time.  All issues noted in this document were discussed and addressed.  A limited physical exam was performed with this format.  Please refer to the patient's chart for his consent to telehealth for [facility].  Evaluation Performed:  Follow-up visit  This visit type was conducted due to national recommendations for restrictions regarding the COVID-19 Pandemic (e.g. social distancing).  This format is felt to be most appropriate for this patient at this time.  All issues noted in this document were discussed and addressed.  No physical exam was performed (except for noted visual exam findings with Video Visits).  Please refer to the patient's chart (MyChart message for video visits and phone note for telephone visits) for the patient's consent to telehealth for [facility].  Date:  09/12/2018   ID:  Zachary Pelt., DOB 1950/07/07, MRN [id number]  Patient Location:  Home  Provider location:   [place]  PCP:  Lujean Amel, MD        Sleep Medicine:  Fransico Him, MD Electrophysiologist:  None   Chief Complaint:  OSA  History of Present Illness:    Zachary Barnhard. is a 68 y.o. male who presents via audio/video conferencing for a telehealth visit today.    This is a 68yo male with a history of OSA who has been on CPAP therapy.  He is doing well with his CPAP device and thinks that he has gotten used to it.  He tolerates the mask and feels the pressure is adequate.  Since going on CPAP he feels rested in the am and  has no significant daytime sleepiness.  He denies any significant mouth or nasal dryness or nasal congestion.  He does not think that he snores.    The patient does not have symptoms concerning for COVID-19 infection (fever, chills, cough, or new shortness of breath).   Prior CV studies:   The following studies were reviewed today:  PAP compliance download      Past Medical History:  Diagnosis Date  . Adenomatous colon polyp    Dr Watt Climes 2004  . Anxiety    social   . Arthritis   . Depression    at one point in his life  . GERD (gastroesophageal reflux disease)    occasionally, take OTC meds x 14 days  . Gout of big toe    in right foot  . Hemorrhoid   . Hypercholesterolemia   . Kidney stones, calcium oxalate   . OSA on CPAP   . Ruptured lumbar disc 2017   treated with - epidural injection, , treated with Dr. Rolena Infante & Ramos  . Seasonal allergies   . Wears glasses         Past Surgical History:  Procedure Laterality Date  . COLONOSCOPY    . ETHMOIDECTOMY N/A 04/03/2013   Procedure: ETHMOIDECTOMY;  Surgeon: Izora Gala, MD;  Location: [facility];  Service: ENT;  Laterality: N/A;  . FINGER TENDON REPAIR  2008   and arteries-lt middle finger  . [place]  calcium oxalate kidney stone  . NASAL SINUS SURGERY  1996  . POLYPECTOMY Bilateral 04/03/2013   Procedure: BILATERAL NASAL POLYPECTOMY ;  Surgeon: Izora Gala, MD;  Location: [place];  Service: ENT;  Laterality: Bilateral;  . TOTAL SHOULDER ARTHROPLASTY Left 03/09/2016   Procedure: LEFT TOTAL SHOULDER ARTHROPLASTY;  Surgeon: Netta Cedars, MD;  Location: [place];  Service: Orthopedics;  Laterality: Left;     Active Medications      Current Meds  Medication Sig  . aspirin 81 MG tablet Take 81 mg by mouth daily.  Marland Kitchen azelastine (ASTELIN) 137 MCG/SPRAY nasal spray Place 2 sprays into both nostrils 2 (two) times daily as needed for allergies.  Use in each nostril as directed  . carisoprodol (SOMA) 350 MG tablet Take 350 mg by mouth daily as needed for muscle spasms. Take as directed  . colchicine 0.6 MG tablet Take 0.6 mg by mouth 2 (two) times daily as needed (gout flare up). Gout flare up  . diclofenac sodium (VOLTAREN) 1 % GEL Apply 2 g topically daily as needed (pain).  Marland Kitchen glucosamine-chondroitin 500-400 MG tablet Take 1 tablet by mouth daily as needed.   . Multiple Vitamin (MULTIVITAMIN) tablet Take 1 tablet by mouth daily.  . Triprolidine-Pseudoephedrine (ANTIHISTAMINE PO) Take 1 tablet by mouth as directed.  Marland Kitchen VITAMIN D PO Take 4,000 Units by mouth daily.       Allergies:   Wasp venom; Percocet [oxycodone-acetaminophen]; and Penicillins   Social History        Tobacco Use  . Smoking status: Never Smoker  . Smokeless tobacco: Never Used  Substance Use Topics  . Alcohol use: Yes    Comment: 2 drinks daily - lessening intake in past 2 mths.   . Drug use: No     Family Hx: The patient's family history includes Alzheimer's disease in his father; Hypertension in his sister; Leukemia in his mother.  ROS:   Please see the history of present illness.     All other systems reviewed and are negative.   Labs/Other Tests and Data Reviewed:    Recent Labs: No results found for requested labs within last 8760 hours.   Recent Lipid Panel Labs (Brief)  No results found for: CHOL, TRIG, HDL, CHOLHDL, LDLCALC, LDLDIRECT       Wt Readings from Last 3 Encounters:  09/09/18 180 lb (81.6 kg)  08/29/17 183 lb 3.2 oz (83.1 kg)  05/02/16 179 lb 12.8 oz (81.6 kg)     Objective:     CONSTITUTIONAL:  Well nourished, well developed male in no acute distress.  EYES: anicteric MOUTH: oral mucosa is pink RESPIRATORY: Normal respiratory effort, symmetric expansion CARDIOVASCULAR: No peripheral edema SKIN: No rash, lesions or ulcers MUSCULOSKELETAL: no digital cyanosis NEURO: Cranial Nerves II-XII grossly  intact, moves all extremities PSYCH: Intact judgement and insight.  A&O x 3, Mood/affect appropriate   ASSESSMENT & PLAN:    1.  OSA - the patient is tolerating PAP therapy well without any problems. The PAP download was reviewed today and showed an AHI of 2.8/hr on 12 cm H2O with 57% compliance in using more than 4 hours nightly.  The patient has been using and benefiting from PAP use and will continue to benefit from therapy.   I encouraged him to be more compliant with his device. He says that his compliance.  He has been having some problems with feeling like the pressure at times is either too much or not enough so I  am going to see if we can change him to an auto CPAP from 4 to 18 cm H2O.  I will get a download in 2 weeks.  He also wants to be changed to a different DME so I will [facility].  2.  COVID-19 Education:The signs and symptoms of COVID-19 were discussed with the patient and how to seek care for testing (follow up with PCP or arrange E-visit).  The importance of social distancing was discussed today.  Patient Risk:   After full review of this patient's clinical status, I feel that they are at least moderate risk at this time.  Time:   Today, I have spent 10 minutes directly with the patient on video discussing medical problems including sleep apnea and its treatement.  We also reviewed the symptoms of COVID 19 and the ways to protect against contracting the virus with telehealth technology.  I spent an additional 5 minutes reviewing patient's chart including CPAP compliance data sheet.  Medication Adjustments/Labs and Tests Ordered: Current medicines are reviewed at length with the patient today.  Concerns regarding medicines are outlined above.  Tests Ordered: No orders of the defined types were placed in this encounter.  Medication Changes: No orders of the defined types were placed in this encounter.   Disposition:   Follow up in 1 year(s)  Signed, Fransico Him, MD  09/12/2018  [facility]

## 2018-09-09 NOTE — Telephone Encounter (Signed)
New message:   Patient states he was on a call for his appt. Please call patient back.

## 2018-09-09 NOTE — Addendum Note (Signed)
Addended by: Sueanne Margarita on: 09/09/2018 08:25 PM   Modules accepted: Level of Service, SmartSet

## 2018-09-10 ENCOUNTER — Other Ambulatory Visit: Payer: Self-pay

## 2018-09-10 ENCOUNTER — Ambulatory Visit (HOSPITAL_BASED_OUTPATIENT_CLINIC_OR_DEPARTMENT_OTHER): Payer: PPO | Admitting: Anesthesiology

## 2018-09-10 ENCOUNTER — Ambulatory Visit: Payer: PPO | Admitting: Cardiology

## 2018-09-10 ENCOUNTER — Ambulatory Visit (HOSPITAL_BASED_OUTPATIENT_CLINIC_OR_DEPARTMENT_OTHER)
Admission: RE | Admit: 2018-09-10 | Discharge: 2018-09-10 | Disposition: A | Discharge status: Home / Self Care | Payer: PPO | Attending: Orthopedic Surgery | Admitting: Orthopedic Surgery

## 2018-09-10 ENCOUNTER — Encounter (HOSPITAL_BASED_OUTPATIENT_CLINIC_OR_DEPARTMENT_OTHER): Payer: Self-pay | Admitting: Emergency Medicine

## 2018-09-10 ENCOUNTER — Encounter (HOSPITAL_BASED_OUTPATIENT_CLINIC_OR_DEPARTMENT_OTHER)
Admission: RE | Disposition: A | Discharge status: Home / Self Care | Payer: Self-pay | Source: Home / Self Care | Attending: Orthopedic Surgery

## 2018-09-10 DIAGNOSIS — M1990 Unspecified osteoarthritis, unspecified site: Secondary | ICD-10-CM | POA: Insufficient documentation

## 2018-09-10 DIAGNOSIS — R2232 Localized swelling, mass and lump, left upper limb: Secondary | ICD-10-CM | POA: Diagnosis not present

## 2018-09-10 DIAGNOSIS — M1A9XX1 Chronic gout, unspecified, with tophus (tophi): Secondary | ICD-10-CM | POA: Insufficient documentation

## 2018-09-10 DIAGNOSIS — Z91038 Other insect allergy status: Secondary | ICD-10-CM | POA: Insufficient documentation

## 2018-09-10 DIAGNOSIS — Z881 Allergy status to other antibiotic agents status: Secondary | ICD-10-CM | POA: Diagnosis not present

## 2018-09-10 DIAGNOSIS — Z86010 Personal history of colonic polyps: Secondary | ICD-10-CM | POA: Insufficient documentation

## 2018-09-10 DIAGNOSIS — G4733 Obstructive sleep apnea (adult) (pediatric): Secondary | ICD-10-CM | POA: Insufficient documentation

## 2018-09-10 DIAGNOSIS — Z9989 Dependence on other enabling machines and devices: Secondary | ICD-10-CM | POA: Diagnosis not present

## 2018-09-10 DIAGNOSIS — Z885 Allergy status to narcotic agent status: Secondary | ICD-10-CM | POA: Diagnosis not present

## 2018-09-10 DIAGNOSIS — S61203A Unspecified open wound of left middle finger without damage to nail, initial encounter: Secondary | ICD-10-CM | POA: Diagnosis not present

## 2018-09-10 DIAGNOSIS — M109 Gout, unspecified: Secondary | ICD-10-CM | POA: Diagnosis not present

## 2018-09-10 DIAGNOSIS — M1000 Idiopathic gout, unspecified site: Secondary | ICD-10-CM

## 2018-09-10 DIAGNOSIS — N189 Chronic kidney disease, unspecified: Secondary | ICD-10-CM | POA: Diagnosis not present

## 2018-09-10 HISTORY — PX: MASS EXCISION: SHX2000

## 2018-09-10 SURGERY — EXCISION MASS
Anesthesia: Monitor Anesthesia Care | Site: Hand | Laterality: Left

## 2018-09-10 MED ORDER — LIDOCAINE HCL (PF) 1 % IJ SOLN
INTRAMUSCULAR | Status: DC | PRN
  Administered 2018-09-10: 5 mL

## 2018-09-10 MED ORDER — LIDOCAINE 2% (20 MG/ML) 5 ML SYRINGE
INTRAMUSCULAR | Status: AC
  Filled 2018-09-10: qty 5

## 2018-09-10 MED ORDER — ONDANSETRON HCL 4 MG/2ML IJ SOLN
INTRAMUSCULAR | Status: AC
  Filled 2018-09-10: qty 2

## 2018-09-10 MED ORDER — ONDANSETRON HCL 4 MG/2ML IJ SOLN: 4.0000 mg | Freq: Once | INTRAMUSCULAR | Status: DC | PRN

## 2018-09-10 MED ORDER — ONDANSETRON HCL 4 MG/2ML IJ SOLN
INTRAMUSCULAR | Status: DC | PRN
  Administered 2018-09-10: 4 mg via INTRAVENOUS

## 2018-09-10 MED ORDER — FENTANYL CITRATE (PF) 100 MCG/2ML IJ SOLN: 50.0000 ug | INTRAMUSCULAR | Status: DC | PRN

## 2018-09-10 MED ORDER — BUPIVACAINE HCL (PF) 0.25 % IJ SOLN
INTRAMUSCULAR | Status: DC | PRN
  Administered 2018-09-10: 5 mL

## 2018-09-10 MED ORDER — OXYCODONE HCL 5 MG PO TABS: 5.0000 mg | ORAL_TABLET | Freq: Once | ORAL | Status: DC | PRN

## 2018-09-10 MED ORDER — MIDAZOLAM HCL 2 MG/2ML IJ SOLN
INTRAMUSCULAR | Status: AC
  Filled 2018-09-10: qty 2

## 2018-09-10 MED ORDER — CLINDAMYCIN PHOSPHATE 900 MG/50ML IV SOLN
900.0000 mg | INTRAVENOUS | Status: AC
  Administered 2018-09-10: 11:00:00 900 mg via INTRAVENOUS

## 2018-09-10 MED ORDER — MIDAZOLAM HCL 2 MG/2ML IJ SOLN
1.0000 mg | INTRAMUSCULAR | Status: DC | PRN
  Administered 2018-09-10: 2 mg via INTRAVENOUS

## 2018-09-10 MED ORDER — FENTANYL CITRATE (PF) 100 MCG/2ML IJ SOLN: 25.0000 ug | INTRAMUSCULAR | Status: DC | PRN

## 2018-09-10 MED ORDER — DEXAMETHASONE SODIUM PHOSPHATE 10 MG/ML IJ SOLN
INTRAMUSCULAR | Status: AC
  Filled 2018-09-10: qty 1

## 2018-09-10 MED ORDER — LACTATED RINGERS IV SOLN
INTRAVENOUS | Status: DC
  Administered 2018-09-10: 09:00:00 via INTRAVENOUS

## 2018-09-10 MED ORDER — PROPOFOL 500 MG/50ML IV EMUL
INTRAVENOUS | Status: DC | PRN
  Administered 2018-09-10: 200 ug/kg/min via INTRAVENOUS

## 2018-09-10 MED ORDER — SCOPOLAMINE 1 MG/3DAYS TD PT72: 1.0000 | MEDICATED_PATCH | Freq: Once | TRANSDERMAL | Status: DC | PRN

## 2018-09-10 MED ORDER — OXYCODONE HCL 5 MG/5ML PO SOLN: 5.0000 mg | Freq: Once | ORAL | Status: DC | PRN

## 2018-09-10 MED ORDER — EPHEDRINE 5 MG/ML INJ
INTRAVENOUS | Status: AC
  Filled 2018-09-10: qty 10

## 2018-09-10 MED ORDER — CHLORHEXIDINE GLUCONATE 4 % EX LIQD: 60.0000 mL | Freq: Once | CUTANEOUS | Status: DC

## 2018-09-10 MED ORDER — PHENYLEPHRINE 40 MCG/ML (10ML) SYRINGE FOR IV PUSH (FOR BLOOD PRESSURE SUPPORT)
PREFILLED_SYRINGE | INTRAVENOUS | Status: AC
  Filled 2018-09-10: qty 10

## 2018-09-10 MED ORDER — SUCCINYLCHOLINE CHLORIDE 200 MG/10ML IV SOSY
PREFILLED_SYRINGE | INTRAVENOUS | Status: AC
  Filled 2018-09-10: qty 10

## 2018-09-10 MED ORDER — CLINDAMYCIN PHOSPHATE 900 MG/50ML IV SOLN
INTRAVENOUS | Status: AC
  Filled 2018-09-10: qty 50

## 2018-09-10 MED ORDER — FENTANYL CITRATE (PF) 100 MCG/2ML IJ SOLN
INTRAMUSCULAR | Status: AC
  Filled 2018-09-10: qty 2

## 2018-09-10 SURGICAL SUPPLY — 48 items
BANDAGE ACE 3X5.8 VEL STRL LF (GAUZE/BANDAGES/DRESSINGS) ×2 IMPLANT
BANDAGE ACE 4X5 VEL STRL LF (GAUZE/BANDAGES/DRESSINGS) IMPLANT
BANDAGE COBAN LF 1.5X5 NS (GAUZE/BANDAGES/DRESSINGS) IMPLANT
BENZOIN TINCTURE PRP APPL 2/3 (GAUZE/BANDAGES/DRESSINGS) IMPLANT
BLADE SURG 15 STRL LF DISP TIS (BLADE) ×2 IMPLANT
BLADE SURG 15 STRL SS (BLADE) ×2
BNDG ESMARK 4X9 LF (GAUZE/BANDAGES/DRESSINGS) IMPLANT
BNDG GAUZE 1X2.1 STRL (MISCELLANEOUS) IMPLANT
CORD BIPOLAR FORCEPS 12FT (ELECTRODE) ×2 IMPLANT
COVER BACK TABLE REUSABLE LG (DRAPES) ×2 IMPLANT
COVER MAYO STAND REUSABLE (DRAPES) ×2 IMPLANT
COVER WAND RF STERILE (DRAPES) IMPLANT
CUFF TOURN SGL QUICK 18X4 (TOURNIQUET CUFF) ×2 IMPLANT
DECANTER SPIKE VIAL GLASS SM (MISCELLANEOUS) IMPLANT
DRAPE EXTREMITY T 121X128X90 (DISPOSABLE) ×2 IMPLANT
DRAPE SURG 17X23 STRL (DRAPES) ×2 IMPLANT
DRSG EMULSION OIL 3X3 NADH (GAUZE/BANDAGES/DRESSINGS) ×2 IMPLANT
GAUZE SPONGE 4X4 12PLY STRL (GAUZE/BANDAGES/DRESSINGS) ×2 IMPLANT
GLOVE BIOGEL PI IND STRL 8.5 (GLOVE) ×1 IMPLANT
GLOVE BIOGEL PI INDICATOR 8.5 (GLOVE) ×1
GLOVE SURG ORTHO 8.0 STRL STRW (GLOVE) ×2 IMPLANT
GOWN STRL REUS W/ TWL LRG LVL3 (GOWN DISPOSABLE) ×1 IMPLANT
GOWN STRL REUS W/ TWL XL LVL3 (GOWN DISPOSABLE) ×1 IMPLANT
GOWN STRL REUS W/TWL LRG LVL3 (GOWN DISPOSABLE) ×1
GOWN STRL REUS W/TWL XL LVL3 (GOWN DISPOSABLE) ×1
NEEDLE HYPO 25X1 1.5 SAFETY (NEEDLE) ×2 IMPLANT
NS IRRIG 1000ML POUR BTL (IV SOLUTION) ×2 IMPLANT
PACK BASIN DAY SURGERY FS (CUSTOM PROCEDURE TRAY) ×2 IMPLANT
PAD CAST 3X4 CTTN HI CHSV (CAST SUPPLIES) IMPLANT
PADDING CAST ABS 3INX4YD NS (CAST SUPPLIES)
PADDING CAST ABS 4INX4YD NS (CAST SUPPLIES) ×1
PADDING CAST ABS COTTON 3X4 (CAST SUPPLIES) IMPLANT
PADDING CAST ABS COTTON 4X4 ST (CAST SUPPLIES) ×1 IMPLANT
PADDING CAST COTTON 3X4 STRL (CAST SUPPLIES)
SPLINT PLASTER CAST XFAST 3X15 (CAST SUPPLIES) IMPLANT
SPLINT PLASTER XTRA FASTSET 3X (CAST SUPPLIES)
STOCKINETTE 4X48 STRL (DRAPES) ×2 IMPLANT
STRIP CLOSURE SKIN 1/2X4 (GAUZE/BANDAGES/DRESSINGS) IMPLANT
SUT MNCRL AB 3-0 PS2 18 (SUTURE) IMPLANT
SUT MNCRL AB 4-0 PS2 18 (SUTURE) IMPLANT
SUT PROLENE 4 0 PS 2 18 (SUTURE) IMPLANT
SWAB COLLECTION DEVICE MRSA (MISCELLANEOUS) IMPLANT
SWAB CULTURE ESWAB REG 1ML (MISCELLANEOUS) IMPLANT
SYR BULB 3OZ (MISCELLANEOUS) ×2 IMPLANT
SYR CONTROL 10ML LL (SYRINGE) ×2 IMPLANT
TOWEL GREEN STERILE FF (TOWEL DISPOSABLE) ×2 IMPLANT
TRAY DSU PREP LF (CUSTOM PROCEDURE TRAY) ×2 IMPLANT
UNDERPAD 30X30 (UNDERPADS AND DIAPERS) ×2 IMPLANT

## 2018-09-10 NOTE — Op Note (Signed)
PREOPERATIVE DIAGNOSIS: Left long finger mass less than 3 cm  POSTOPERATIVE DIAGNOSIS: Same  ATTENDING SURGEON: Dr. Iran Planas who scrubbed and present for the entire procedure  ASSISTANT SURGEON: Gertie Fey, PA-C was scrubbed and present for mass removal and Tina synovectomy and closure in a timely fashion  ANESTHESIA: 1% Xylocaine quarter percent Marcaine local block with IV sedation  OPERATIVE PROCEDURE: #1: Left long finger mass excision deep mass less than 3 cm #2: Left long finger tenosynovectomy flexor digitorum profundus  IMPLANTS: None  RADIOGRAPHIC INTERPRETATION: None  SURGICAL INDICATIONS: Patient is a right-hand-dominant gentleman who had the persistent mass directly over the left long finger with a draining wound.  Patient was seen and evaluated in the office and recommended undergo the above procedure.  Risks of surgery include but not limited to bleeding infection damage nearby nerves arteries or tendons loss of motion of the wrist and digits incomplete relief of symptoms and need for further surgical intervention.  SURGICAL TECHNIQUE: Patient was palpated and found the preoperative holding area marked with a permanent marker made on the left long finger to indicate the correct operative site.  The patient brought back the operating placed supine on the anesthesia table where the IV sedation was administered.  Preoperative antibiotics were given.  Local anesthetic was administered.  Well-padded tourniquet placed on the left brachium and stay with the appropriate drape.  The left upper extremities then prepped and draped in normal sterile fashion.  A timeout was called the correct site was identified the procedure then begun.  Attention was then turned to the long finger small Bryner incision made directly over the mass dissection carried down through the skin and subcutaneous tissue.  Deep dissection carried down to the flexor sheath where the mass was encountered.  This  was a large gouty tophus.  This encompassed encompassed the flexor tendon, flexor digitorum profundus.  Tenosynovectomy was then carried out of the flexor digitorum profundus after removal of the mass.  The wound was thoroughly irrigated.  After aggressive debridement of the tendon and removal of the mass the skin was then reapproximated using simple Prolene sutures.  Adaptic dressing and a sterile compressive bandage then applied.  The patient tolerated the procedure well during recovery in good condition.  POSTOPERATIVE PLAN: Patient be discharged to home.  See him back in the office in 8 days for wound check likely suture removal at the 2-week mark.  Radiographs of the finger at the first visit.

## 2018-09-10 NOTE — Anesthesia Preprocedure Evaluation (Addendum)
Anesthesia Evaluation  Patient identified by MRN, date of birth, ID band Patient awake    Reviewed: Allergy & Precautions, Patient's Chart, lab work & pertinent test results  History of Anesthesia Complications Negative for: history of anesthetic complications  Airway Mallampati: II  TM Distance: >3 FB Neck ROM: Full    Dental no notable dental hx. (+) Teeth Intact   Pulmonary sleep apnea and Continuous Positive Airway Pressure Ventilation ,    Pulmonary exam normal        Cardiovascular Exercise Tolerance: Good negative cardio ROS Normal cardiovascular exam     Neuro/Psych negative neurological ROS  negative psych ROS   GI/Hepatic Neg liver ROS, GERD  ,  Endo/Other  negative endocrine ROS  Renal/GU Renal InsufficiencyRenal disease  negative genitourinary   Musculoskeletal  (+) Arthritis  (gout),   Abdominal   Peds  Hematology negative hematology ROS (+)   Anesthesia Other Findings   Reproductive/Obstetrics                            Anesthesia Physical Anesthesia Plan  ASA: II  Anesthesia Plan: MAC   Post-op Pain Management:    Induction:   PONV Risk Score and Plan: 1 and Propofol infusion and Treatment may vary due to age or medical condition  Airway Management Planned: Simple Face Mask  Additional Equipment: None  Intra-op Plan:   Post-operative Plan:   Informed Consent: I have reviewed the patients History and Physical, chart, labs and discussed the procedure including the risks, benefits and alternatives for the proposed anesthesia with the patient or authorized representative who has indicated his/her understanding and acceptance.       Plan Discussed with:   Anesthesia Plan Comments:         Anesthesia Quick Evaluation

## 2018-09-10 NOTE — Transfer of Care (Signed)
Immediate Anesthesia Transfer of Care Note  Patient: Zachary Alvarado.  Procedure(s) Performed: Left long finger debridement and drainage, mass removal (Left Hand)  Patient Location: PACU  Anesthesia Type:MAC  Level of Consciousness: awake, alert  and oriented  Airway & Oxygen Therapy: Patient Spontanous Breathing and Patient connected to face mask oxygen  Post-op Assessment: Report given to RN and Post -op Vital signs reviewed and stable  Post vital signs: Reviewed and stable  Last Vitals:  Vitals Value Taken Time  BP    Temp    Pulse 66 09/10/2018 11:44 AM  Resp 49 09/10/2018 11:44 AM  SpO2 100 % 09/10/2018 11:44 AM  Vitals shown include unvalidated device data.  Last Pain:  Vitals:   09/10/18 0900  TempSrc: Oral  PainSc: 0-No pain         Complications: No apparent anesthesia complications

## 2018-09-10 NOTE — Anesthesia Postprocedure Evaluation (Signed)
Anesthesia Post Note  Patient: Zachary Alvarado.  Procedure(s) Performed: Left long finger debridement and drainage, mass removal (Left Hand)     Patient location during evaluation: PACU Anesthesia Type: MAC Level of consciousness: awake and alert Pain management: pain level controlled Vital Signs Assessment: post-procedure vital signs reviewed and stable Respiratory status: spontaneous breathing, nonlabored ventilation and respiratory function stable Cardiovascular status: blood pressure returned to baseline and stable Postop Assessment: no apparent nausea or vomiting Anesthetic complications: no    Last Vitals:  Vitals:   09/10/18 1200 09/10/18 1207  BP:  113/83  Pulse: 68 69  Resp: 12 16  Temp:  36.7 C  SpO2: 100% 100%    Last Pain:  Vitals:   09/10/18 1207  TempSrc:   PainSc: 0-No pain                 Lidia Collum

## 2018-09-10 NOTE — Discharge Instructions (Signed)
[facility]EEP [facility] CALL OFFICE FOR F/U APPT [phone] in 8 days RX sent to pharmacy KEEP [place] OK TO APPLY ICE TO OPERATIVE AREA CONTACT OFFICE IF ANY WORSENING PAIN OR CONCERNS.   Post Anesthesia Home Care Instructions  Activity: Get plenty of rest for the remainder of the day. A responsible individual must stay with you for 24 hours following the procedure.  For the next 24 hours, DO NOT: -Drive a car -Paediatric nurse -Drink alcoholic beverages -Take any medication unless instructed by your physician -Make any legal decisions or sign important papers.  Meals: Start with liquid foods such as gelatin or soup. Progress to regular foods as tolerated. Avoid greasy, spicy, heavy foods. If nausea and/or vomiting occur, drink only clear liquids until the nausea and/or vomiting subsides. Call your physician if vomiting continues.  Special Instructions/Symptoms: Your throat may feel dry or sore from the anesthesia or the breathing tube placed in your throat during surgery. If this causes discomfort, gargle with warm salt water. The discomfort should disappear within 24 hours.

## 2018-09-10 NOTE — Anesthesia Procedure Notes (Signed)
Procedure Name: MAC Date/Time: 09/10/2018 11:03 AM Performed by: Willa Frater, CRNA Pre-anesthesia Checklist: Patient identified, Emergency Drugs available, Suction available and Patient being monitored Patient Re-evaluated:Patient Re-evaluated prior to induction Oxygen Delivery Method: Simple face mask Induction Type: IV induction

## 2018-09-11 ENCOUNTER — Encounter (HOSPITAL_BASED_OUTPATIENT_CLINIC_OR_DEPARTMENT_OTHER): Payer: Self-pay | Admitting: Orthopedic Surgery

## 2018-09-12 ENCOUNTER — Telehealth (INDEPENDENT_AMBULATORY_CARE_PROVIDER_SITE_OTHER): Payer: PPO | Admitting: Cardiology

## 2018-09-12 ENCOUNTER — Encounter: Payer: Self-pay | Admitting: Cardiology

## 2018-09-12 ENCOUNTER — Other Ambulatory Visit: Payer: Self-pay

## 2018-09-12 ENCOUNTER — Telehealth: Payer: Self-pay | Admitting: *Deleted

## 2018-09-12 VITALS — BP 119/79 | HR 67 | Ht 69.0 in | Wt 179.0 lb

## 2018-09-12 DIAGNOSIS — Z7189 Other specified counseling: Secondary | ICD-10-CM | POA: Diagnosis not present

## 2018-09-12 DIAGNOSIS — Z9989 Dependence on other enabling machines and devices: Secondary | ICD-10-CM | POA: Diagnosis not present

## 2018-09-12 DIAGNOSIS — G4733 Obstructive sleep apnea (adult) (pediatric): Secondary | ICD-10-CM

## 2018-09-12 NOTE — Telephone Encounter (Signed)
MYCHART MESSAGE: Mr Crysler,  I have faxed the order and all sleep studies over to [facility] today. Their number is [phone] contact person is Francoise Ceo. If she needs anything else she will contact me other wise Anderson Malta the respiratory therapist will contact you about the change.  Thanks, Gershon Cull, [facility] Sleep Coordinator

## 2018-09-12 NOTE — Patient Instructions (Signed)

## 2018-09-12 NOTE — Telephone Encounter (Signed)
Order placed to [facility]. Referral sent to [facility].

## 2018-09-12 NOTE — Telephone Encounter (Signed)
-----   Message from Sarina Ill, RN sent at 09/12/2018  9:49 AM EDT ----- Regarding: FW: followup  ----- Message ----- From: Sueanne Margarita, MD Sent: 09/12/2018   9:12 AM EDT To: Sarina Ill, RN Subject: followup                                       Please forward to Zachary Alvarado to change patient to [facility] for DME services.  Have may not find out if they can put his current CPAP machine to an auto CPAP from 4 to 18 cm H2O as he is not tolerating the set pressure.  He will then need a download 2 weeks later.  Please have him follow-up with me in 1 year.

## 2018-09-18 DIAGNOSIS — Z5189 Encounter for other specified aftercare: Secondary | ICD-10-CM | POA: Diagnosis not present

## 2018-09-18 DIAGNOSIS — M109 Gout, unspecified: Secondary | ICD-10-CM | POA: Diagnosis not present

## 2018-09-30 DIAGNOSIS — G4733 Obstructive sleep apnea (adult) (pediatric): Secondary | ICD-10-CM | POA: Diagnosis not present

## 2018-10-28 DIAGNOSIS — Z86010 Personal history of colonic polyps: Secondary | ICD-10-CM | POA: Diagnosis not present

## 2018-10-28 DIAGNOSIS — K5730 Diverticulosis of large intestine without perforation or abscess without bleeding: Secondary | ICD-10-CM | POA: Diagnosis not present

## 2018-10-28 DIAGNOSIS — K635 Polyp of colon: Secondary | ICD-10-CM | POA: Diagnosis not present

## 2018-10-28 DIAGNOSIS — D126 Benign neoplasm of colon, unspecified: Secondary | ICD-10-CM | POA: Diagnosis not present

## 2018-10-31 DIAGNOSIS — D126 Benign neoplasm of colon, unspecified: Secondary | ICD-10-CM | POA: Diagnosis not present

## 2018-10-31 DIAGNOSIS — K635 Polyp of colon: Secondary | ICD-10-CM | POA: Diagnosis not present

## 2018-11-03 DIAGNOSIS — M109 Gout, unspecified: Secondary | ICD-10-CM | POA: Diagnosis not present

## 2018-11-03 DIAGNOSIS — R7301 Impaired fasting glucose: Secondary | ICD-10-CM | POA: Diagnosis not present

## 2018-11-03 DIAGNOSIS — N529 Male erectile dysfunction, unspecified: Secondary | ICD-10-CM | POA: Diagnosis not present

## 2018-11-03 DIAGNOSIS — Z79899 Other long term (current) drug therapy: Secondary | ICD-10-CM | POA: Diagnosis not present

## 2018-11-03 DIAGNOSIS — E7800 Pure hypercholesterolemia, unspecified: Secondary | ICD-10-CM | POA: Diagnosis not present

## 2018-11-03 DIAGNOSIS — Z125 Encounter for screening for malignant neoplasm of prostate: Secondary | ICD-10-CM | POA: Diagnosis not present

## 2018-11-03 DIAGNOSIS — Z0001 Encounter for general adult medical examination with abnormal findings: Secondary | ICD-10-CM | POA: Diagnosis not present

## 2019-02-10 DIAGNOSIS — Z23 Encounter for immunization: Secondary | ICD-10-CM | POA: Diagnosis not present

## 2019-03-10 DIAGNOSIS — M79672 Pain in left foot: Secondary | ICD-10-CM | POA: Diagnosis not present

## 2019-04-10 DIAGNOSIS — G4733 Obstructive sleep apnea (adult) (pediatric): Secondary | ICD-10-CM | POA: Diagnosis not present

## 2019-04-20 DIAGNOSIS — L821 Other seborrheic keratosis: Secondary | ICD-10-CM | POA: Diagnosis not present

## 2019-04-20 DIAGNOSIS — D225 Melanocytic nevi of trunk: Secondary | ICD-10-CM | POA: Diagnosis not present

## 2019-04-20 DIAGNOSIS — Z85828 Personal history of other malignant neoplasm of skin: Secondary | ICD-10-CM | POA: Diagnosis not present

## 2019-04-20 DIAGNOSIS — Z23 Encounter for immunization: Secondary | ICD-10-CM | POA: Diagnosis not present

## 2019-04-20 DIAGNOSIS — W57XXXA Bitten or stung by nonvenomous insect and other nonvenomous arthropods, initial encounter: Secondary | ICD-10-CM | POA: Diagnosis not present

## 2019-04-20 DIAGNOSIS — L570 Actinic keratosis: Secondary | ICD-10-CM | POA: Diagnosis not present

## 2019-04-20 DIAGNOSIS — S70261A Insect bite (nonvenomous), right hip, initial encounter: Secondary | ICD-10-CM | POA: Diagnosis not present

## 2019-04-20 DIAGNOSIS — L814 Other melanin hyperpigmentation: Secondary | ICD-10-CM | POA: Diagnosis not present

## 2019-06-19 ENCOUNTER — Ambulatory Visit: Payer: PPO

## 2019-06-27 ENCOUNTER — Ambulatory Visit: Payer: PPO | Attending: Internal Medicine

## 2019-06-27 DIAGNOSIS — Z23 Encounter for immunization: Secondary | ICD-10-CM | POA: Insufficient documentation

## 2019-06-27 NOTE — Progress Notes (Signed)
   [id number] [facility]  Name:  Zachary Alvarado.    MRN: [id number] DOB: March 13, 1951  06/27/2019  Zachary Alvarado was observed post Covid-19 immunization for 15 minutes without incidence. He was provided with Vaccine Information Sheet and instruction to access the V-Safe system.   Zachary Alvarado was instructed to call 911 with any severe reactions post vaccine: Marland Kitchen Difficulty breathing  . Swelling of your face and throat  . A fast heartbeat  . A bad rash all over your body  . Dizziness and weakness    Immunizations Administered    Name Date Dose VIS Date Route   Pfizer COVID-19 Vaccine 06/27/2019  3:27 PM 0.3 mL 05/01/2019 Intramuscular   Manufacturer: [place]   Lot: [id number]   [place]: [id number]

## 2019-07-06 ENCOUNTER — Ambulatory Visit: Payer: PPO

## 2019-07-22 ENCOUNTER — Ambulatory Visit: Payer: PPO | Attending: Internal Medicine

## 2019-07-22 DIAGNOSIS — Z23 Encounter for immunization: Secondary | ICD-10-CM | POA: Insufficient documentation

## 2019-07-22 NOTE — Progress Notes (Signed)
   [id number] Vaccination Clinic  Name:  Zachary Alvarado.    MRN: [id number] DOB: 21-Sep-1950  07/22/2019  [name] was observed post Covid-19 immunization for 15 minutes without incident. He was provided with Vaccine Information Sheet and instruction to access the V-Safe system.   [name] was instructed to call 911 with any severe reactions post vaccine: Marland Kitchen Difficulty breathing  . Swelling of face and throat  . A fast heartbeat  . A bad rash all over body  . Dizziness and weakness   Immunizations Administered    Name Date Dose VIS Date Route   Pfizer COVID-19 Vaccine 07/22/2019 11:40 AM 0.3 mL 05/01/2019 Intramuscular   Manufacturer: [place]   Lot: [id number]   [place]: [id number]

## 2019-08-04 DIAGNOSIS — H6122 Impacted cerumen, left ear: Secondary | ICD-10-CM | POA: Diagnosis not present

## 2019-08-20 DIAGNOSIS — L309 Dermatitis, unspecified: Secondary | ICD-10-CM | POA: Diagnosis not present

## 2019-08-24 DIAGNOSIS — H43811 Vitreous degeneration, right eye: Secondary | ICD-10-CM | POA: Diagnosis not present

## 2019-09-13 NOTE — Progress Notes (Addendum)
Virtual Visit via Telephone Note   This visit type was conducted due to national recommendations for restrictions regarding the COVID-19 Pandemic (e.g. social distancing) in an effort to limit this patient's exposure and mitigate transmission in our community.  Due to his co-morbid illnesses, this patient is at least at moderate risk for complications without adequate follow up.  This format is felt to be most appropriate for this patient at this time.  The patient did not have access to video technology/had technical difficulties with video requiring transitioning to audio format only (telephone).  All issues noted in this document were discussed and addressed.  No physical exam could be performed with this format.  Please refer to the patient's chart for his  consent to telehealth for [facility].   Evaluation Performed:  Follow-up visit  This visit type was conducted due to national recommendations for restrictions regarding the COVID-19 Pandemic (e.g. social distancing).  This format is felt to be most appropriate for this patient at this time.  All issues noted in this document were discussed and addressed.  No physical exam was performed (except for noted visual exam findings with Video Visits).  Please refer to the patient's chart (MyChart message for video visits and phone note for telephone visits) for the patient's consent to telehealth for [facility].  Date:  09/16/2020   ID:  Zachary Pelt., DOB Jan 01, 1951, MRN [id number]  Patient Location:  Home  Provider location:   [place]  PCP:  Lujean Amel, MD  Sleep Medicine:  Fransico Him, MD  Electrophysiologist:  None   Chief Complaint:  OSA  History of Present Illness:    Zachary Snide. is a 69 y.o. male who presents via audio/video conferencing for a telehealth visit today.    This is a 69yo male with a history of OSA who has been on CPAP therapy. He is doing well with his CPAP device and thinks that  he has gotten used to it.  He tolerates the mask but feels at night sometimes when he wakes up that he is getting too much air.  He turned his pressure down.  He breathes through his mouth some at night and then swallows a lot of air and starts to belch.  He goes to bed around 10pm and around 1:30-2am he takes off the mask because it wakes him up and he takes it off and goes back to sleep.  Since going on CPAP he feels rested in the am and has no significant daytime sleepiness.  He denies any significant mouth or nasal dryness or nasal congestion.  He does not think that he snores.    The patient does not have symptoms concerning for COVID-19 infection (fever, chills, cough, or new shortness of breath).   Prior CV studies:   The following studies were reviewed today:  PAP compliance download  Past Medical History:  Diagnosis Date  . Adenomatous colon polyp    Dr Watt Climes 2004  . Anxiety    social   . Arthritis   . Depression    at one point in his life  . GERD (gastroesophageal reflux disease)    occasionally, take OTC meds x 14 days  . Gout of big toe    in right foot  . Hemorrhoid   . Hypercholesterolemia   . Kidney stones, calcium oxalate   . OSA on CPAP   . Ruptured lumbar disc 2017   treated with - epidural injection, , treated with  Dr. Rolena Infante & Ramos  . Seasonal allergies   . Wears glasses    Past Surgical History:  Procedure Laterality Date  . COLONOSCOPY    . ETHMOIDECTOMY N/A 04/03/2013   Procedure: ETHMOIDECTOMY;  Surgeon: Izora Gala, MD;  Location: [facility];  Service: ENT;  Laterality: N/A;  . FINGER TENDON REPAIR  2008   and arteries-lt middle finger  . KIDNEY STONE SURGERY  1996   calcium oxalate kidney stone  . MASS EXCISION Left 09/10/2018   Procedure: Left long finger debridement and drainage, mass removal;  Surgeon: Iran Planas, MD;  Location: [place];  Service: Orthopedics;  Laterality: Left;  92min  . NASAL SINUS SURGERY   1996  . POLYPECTOMY Bilateral 04/03/2013   Procedure: BILATERAL NASAL POLYPECTOMY ;  Surgeon: Izora Gala, MD;  Location: [facility] [place];  Service: ENT;  Laterality: Bilateral;  . TOTAL SHOULDER ARTHROPLASTY Left 03/09/2016   Procedure: LEFT TOTAL SHOULDER ARTHROPLASTY;  Surgeon: Netta Cedars, MD;  Location: [place];  Service: Orthopedics;  Laterality: Left;     Current Meds  Medication Sig  . allopurinol (ZYLOPRIM) 100 MG tablet Take 100 mg by mouth daily.  Marland Kitchen aspirin 81 MG tablet Take 81 mg by mouth daily.  Marland Kitchen azelastine (ASTELIN) 137 MCG/SPRAY nasal spray Place 2 sprays into both nostrils 2 (two) times daily as needed for allergies. Use in each nostril as directed  . carisoprodol (SOMA) 350 MG tablet Take 350 mg by mouth daily as needed for muscle spasms. Take as directed  . colchicine 0.6 MG tablet Take 0.6 mg by mouth 2 (two) times daily as needed (gout flare up). Gout flare up  . diclofenac sodium (VOLTAREN) 1 % GEL Apply 2 g topically daily as needed (pain).  . Multiple Vitamin (MULTIVITAMIN) tablet Take 1 tablet by mouth daily.  . Triprolidine-Pseudoephedrine (ANTIHISTAMINE PO) Take 1 tablet by mouth as directed.  Marland Kitchen VITAMIN D PO Take 4,000 Units by mouth daily.  . [DISCONTINUED] sildenafil (REVATIO) 20 MG tablet Take 20-40 mg by mouth as needed (30 minutes prior to sexual intercourse). (Patient not taking: Reported on 09/07/2020)     Allergies:   Wasp venom, Percocet [oxycodone-acetaminophen], and Penicillins   Social History   Tobacco Use  . Smoking status: Never Smoker  . Smokeless tobacco: Never Used  Vaping Use  . Vaping Use: Never used  Substance Use Topics  . Alcohol use: Yes    Comment: 2 drinks daily - lessening intake in past 2 mths.   . Drug use: No     Family Hx: The patient's family history includes Alzheimer's disease in his father; Hypertension in his sister; Leukemia in his mother.  ROS:   Please see the history of present illness.     All other  systems reviewed and are negative.   Labs/Other Tests and Data Reviewed:    Recent Labs: No results found for requested labs within last 8760 hours.   Recent Lipid Panel No results found for: CHOL, TRIG, HDL, CHOLHDL, LDLCALC, LDLDIRECT  Wt Readings from Last 3 Encounters:  09/07/20 179 lb 3.2 oz (81.3 kg)  09/14/19 180 lb (81.6 kg)  09/12/18 179 lb (81.2 kg)     Objective:    Vital Signs:  BP 118/79   Pulse 64   Ht 5\' 9"  (1.753 m)   Wt 180 lb (81.6 kg)   BMI 26.58 kg/m    CONSTITUTIONAL:  Well nourished, well developed male in no acute distress.  EYES:  anicteric MOUTH: oral mucosa is pink RESPIRATORY: Normal respiratory effort, symmetric expansion CARDIOVASCULAR: No peripheral edema SKIN: No rash, lesions or ulcers MUSCULOSKELETAL: no digital cyanosis NEURO: Cranial Nerves II-XII grossly intact, moves all extremities PSYCH: Intact judgement and insight.  A&O x 3, Mood/affect appropriate   ASSESSMENT & PLAN:    1.  OSA -  The patient is tolerating PAP therapy well without any problems. The PAP download was reviewed today and showed an AHI of 3.7/hr on 12 cm H2O with 23% compliance in using more than 4 hours nightly.  The patient has been using and benefiting from PAP use and will continue to benefit from therapy. -since he is taking his mask off at night because the pressure does not feel right -I will change him to auto CPAP from 4-18cm H2O and change his mask to a Dreamwear under the nose FFM -followup with me in 6 weeks   COVID-19 Education: The signs and symptoms of COVID-19 were discussed with the patient and how to seek care for testing (follow up with PCP or arrange E-visit).  The importance of social distancing was discussed today.  Patient Risk:   After full review of this patient's clinical status, I feel that they are at least moderate risk at this time.  Time:   Today, I have spent 20 minutes on teoemedicine2discussing medical problems including OSA and  reviewing patient's chart including PAP compliance download  Medication Adjustments/Labs and Tests Ordered: Current medicines are reviewed at length with the patient today.  Concerns regarding medicines are outlined above.  Tests Ordered: No orders of the defined types were placed in this encounter.  Medication Changes: No orders of the defined types were placed in this encounter.   Disposition:  Follow up in 6 weeks  Signed, Fransico Him, MD  09/16/2020 10:48 AM    [place]

## 2019-09-14 ENCOUNTER — Encounter: Payer: Self-pay | Admitting: Cardiology

## 2019-09-14 ENCOUNTER — Telehealth: Payer: Self-pay | Admitting: *Deleted

## 2019-09-14 ENCOUNTER — Telehealth (INDEPENDENT_AMBULATORY_CARE_PROVIDER_SITE_OTHER): Payer: PPO | Admitting: Cardiology

## 2019-09-14 VITALS — BP 118/79 | HR 64 | Ht 69.0 in | Wt 180.0 lb

## 2019-09-14 DIAGNOSIS — Z9989 Dependence on other enabling machines and devices: Secondary | ICD-10-CM

## 2019-09-14 DIAGNOSIS — G4733 Obstructive sleep apnea (adult) (pediatric): Secondary | ICD-10-CM

## 2019-09-14 DIAGNOSIS — H43811 Vitreous degeneration, right eye: Secondary | ICD-10-CM | POA: Diagnosis not present

## 2019-09-14 NOTE — Telephone Encounter (Signed)
-----   Message from Sueanne Margarita, MD sent at 09/14/2019  9:42 AM EDT ----- Change to auto CPAP from 4-18cm H2O and change his mask to a Dreamwear under the nose FFM -followup with me in 6 weeks

## 2019-09-14 NOTE — Telephone Encounter (Signed)
..    Patient Consent for Virtual Visit         Zachary Dearment. has provided verbal consent on 09/14/2019 for a virtual visit (video or telephone).   CONSENT FOR VIRTUAL VISIT FOR:  Zachary Pelt.  By participating in this virtual visit I agree to the following:  I hereby voluntarily request, consent and authorize [facility] and its employed or contracted physicians, physician assistants, nurse practitioners or other licensed health care professionals (the Practitioner), to provide me with telemedicine health care services (the "Services") as deemed necessary by the treating Practitioner. I acknowledge and consent to receive the Services by the Practitioner via telemedicine. I understand that the telemedicine visit will involve communicating with the Practitioner through live audiovisual communication technology and the disclosure of certain medical information by electronic transmission. I acknowledge that I have been given the opportunity to request an in-person assessment or other available alternative prior to the telemedicine visit and am voluntarily participating in the telemedicine visit.  I understand that I have the right to withhold or withdraw my consent to the use of telemedicine in the course of my care at any time, without affecting my right to future care or treatment, and that the Practitioner or I may terminate the telemedicine visit at any time. I understand that I have the right to inspect all information obtained and/or recorded in the course of the telemedicine visit and may receive copies of available information for a reasonable fee.  I understand that some of the potential risks of receiving the Services via telemedicine include:  Marland Kitchen Delay or interruption in medical evaluation due to technological equipment failure or disruption; . Information transmitted may not be sufficient (e.g. poor resolution of images) to allow for appropriate medical decision making by  the Practitioner; and/or  . In rare instances, security protocols could fail, causing a breach of personal health information.  Furthermore, I acknowledge that it is my responsibility to provide information about my medical history, conditions and care that is complete and accurate to the best of my ability. I acknowledge that Practitioner's advice, recommendations, and/or decision may be based on factors not within their control, such as incomplete or inaccurate data provided by me or distortions of diagnostic images or specimens that may result from electronic transmissions. I understand that the practice of medicine is not an exact science and that Practitioner makes no warranties or guarantees regarding treatment outcomes. I acknowledge that a copy of this consent can be made available to me via my patient portal ([facility]), or I can request a printed copy by calling the office of [facility].    I understand that my insurance will be billed for this visit.   I have read or had this consent read to me. . I understand the contents of this consent, which adequately explains the benefits and risks of the Services being provided via telemedicine.  . I have been provided ample opportunity to ask questions regarding this consent and the Services and have had my questions answered to my satisfaction. . I give my informed consent for the services to be provided through the use of telemedicine in my medical care

## 2019-09-14 NOTE — Telephone Encounter (Signed)
Order placed to [facility] to Change to auto CPAP from 4-18cm H2O and change his mask to a Dreamwear under the nose FFM  -followup with me in 6 weeks.

## 2019-10-08 ENCOUNTER — Telehealth: Payer: Self-pay

## 2019-10-08 NOTE — Telephone Encounter (Signed)
Pt calling back returning call, did not know who called him. Please address

## 2019-10-08 NOTE — Telephone Encounter (Signed)
Called patient and left message that the order for his cpap machine and mask was sent [facility] to [facility] on 4/26.

## 2019-10-08 NOTE — Telephone Encounter (Signed)
Pt calling requesting for Gae Bon, CMA, to give him a call back concerning the recommendation that Dr. Radford Pax had given new settings for his CPAP machine. Please address

## 2019-10-13 DIAGNOSIS — L821 Other seborrheic keratosis: Secondary | ICD-10-CM | POA: Diagnosis not present

## 2019-10-13 DIAGNOSIS — L810 Postinflammatory hyperpigmentation: Secondary | ICD-10-CM | POA: Diagnosis not present

## 2019-10-13 DIAGNOSIS — L570 Actinic keratosis: Secondary | ICD-10-CM | POA: Diagnosis not present

## 2019-10-14 DIAGNOSIS — G4733 Obstructive sleep apnea (adult) (pediatric): Secondary | ICD-10-CM | POA: Diagnosis not present

## 2019-10-15 ENCOUNTER — Telehealth: Payer: Self-pay | Admitting: *Deleted

## 2019-10-15 NOTE — Telephone Encounter (Signed)
-----   Message from Sueanne Margarita, MD sent at 10/14/2019  2:46 PM EDT ----- Regarding: RE: Pressure Change I am fine with that  Traci ----- Message ----- From: Freada Bergeron, CMA Sent: 10/14/2019   1:42 PM EDT To: Sueanne Margarita, MD Subject: Pressure Change                                [facility] with Anderson Malta and Anderson Malta says his machine is not an auto capable machine. He was not issued an auto machine. The settings order to Change to auto CPAP from 4-18cm H2O is too strong for the patient.  Patient wants to go back to his original setting of 4-13cm H20.      Thanks, Gae Bon

## 2019-10-15 NOTE — Telephone Encounter (Signed)
Patients original settings of 4-13 was changed back.

## 2019-11-05 DIAGNOSIS — E7800 Pure hypercholesterolemia, unspecified: Secondary | ICD-10-CM | POA: Diagnosis not present

## 2019-11-05 DIAGNOSIS — Z125 Encounter for screening for malignant neoplasm of prostate: Secondary | ICD-10-CM | POA: Diagnosis not present

## 2019-11-05 DIAGNOSIS — Z0001 Encounter for general adult medical examination with abnormal findings: Secondary | ICD-10-CM | POA: Diagnosis not present

## 2019-11-05 DIAGNOSIS — R7309 Other abnormal glucose: Secondary | ICD-10-CM | POA: Diagnosis not present

## 2019-11-05 DIAGNOSIS — M109 Gout, unspecified: Secondary | ICD-10-CM | POA: Diagnosis not present

## 2019-11-05 DIAGNOSIS — Z79899 Other long term (current) drug therapy: Secondary | ICD-10-CM | POA: Diagnosis not present

## 2019-11-10 DIAGNOSIS — E7800 Pure hypercholesterolemia, unspecified: Secondary | ICD-10-CM | POA: Diagnosis not present

## 2019-11-10 DIAGNOSIS — Z0001 Encounter for general adult medical examination with abnormal findings: Secondary | ICD-10-CM | POA: Diagnosis not present

## 2019-11-10 DIAGNOSIS — R7309 Other abnormal glucose: Secondary | ICD-10-CM | POA: Diagnosis not present

## 2019-11-10 DIAGNOSIS — M109 Gout, unspecified: Secondary | ICD-10-CM | POA: Diagnosis not present

## 2020-02-02 DIAGNOSIS — Z23 Encounter for immunization: Secondary | ICD-10-CM | POA: Diagnosis not present

## 2020-02-04 DIAGNOSIS — G4733 Obstructive sleep apnea (adult) (pediatric): Secondary | ICD-10-CM | POA: Diagnosis not present

## 2020-02-10 DIAGNOSIS — H25013 Cortical age-related cataract, bilateral: Secondary | ICD-10-CM | POA: Diagnosis not present

## 2020-02-10 DIAGNOSIS — H5213 Myopia, bilateral: Secondary | ICD-10-CM | POA: Diagnosis not present

## 2020-02-10 DIAGNOSIS — H2513 Age-related nuclear cataract, bilateral: Secondary | ICD-10-CM | POA: Diagnosis not present

## 2020-02-10 DIAGNOSIS — H43811 Vitreous degeneration, right eye: Secondary | ICD-10-CM | POA: Diagnosis not present

## 2020-03-21 DIAGNOSIS — D485 Neoplasm of uncertain behavior of skin: Secondary | ICD-10-CM | POA: Diagnosis not present

## 2020-03-21 DIAGNOSIS — L821 Other seborrheic keratosis: Secondary | ICD-10-CM | POA: Diagnosis not present

## 2020-04-20 DIAGNOSIS — Z85828 Personal history of other malignant neoplasm of skin: Secondary | ICD-10-CM | POA: Diagnosis not present

## 2020-04-20 DIAGNOSIS — D225 Melanocytic nevi of trunk: Secondary | ICD-10-CM | POA: Diagnosis not present

## 2020-04-20 DIAGNOSIS — L578 Other skin changes due to chronic exposure to nonionizing radiation: Secondary | ICD-10-CM | POA: Diagnosis not present

## 2020-04-20 DIAGNOSIS — D485 Neoplasm of uncertain behavior of skin: Secondary | ICD-10-CM | POA: Diagnosis not present

## 2020-04-20 DIAGNOSIS — L814 Other melanin hyperpigmentation: Secondary | ICD-10-CM | POA: Diagnosis not present

## 2020-04-20 DIAGNOSIS — D044 Carcinoma in situ of skin of scalp and neck: Secondary | ICD-10-CM | POA: Diagnosis not present

## 2020-04-20 DIAGNOSIS — L821 Other seborrheic keratosis: Secondary | ICD-10-CM | POA: Diagnosis not present

## 2020-04-20 DIAGNOSIS — L570 Actinic keratosis: Secondary | ICD-10-CM | POA: Diagnosis not present

## 2020-05-30 ENCOUNTER — Encounter: Payer: Self-pay | Admitting: Cardiology

## 2020-06-16 DIAGNOSIS — G4733 Obstructive sleep apnea (adult) (pediatric): Secondary | ICD-10-CM | POA: Diagnosis not present

## 2020-06-22 ENCOUNTER — Other Ambulatory Visit: Payer: Self-pay

## 2020-06-22 ENCOUNTER — Telehealth: Payer: PPO | Admitting: Cardiology

## 2020-06-29 ENCOUNTER — Telehealth (INDEPENDENT_AMBULATORY_CARE_PROVIDER_SITE_OTHER): Payer: PPO | Admitting: Cardiology

## 2020-06-29 ENCOUNTER — Telehealth: Payer: Self-pay | Admitting: *Deleted

## 2020-06-29 VITALS — Ht 69.0 in

## 2020-06-29 DIAGNOSIS — G4733 Obstructive sleep apnea (adult) (pediatric): Secondary | ICD-10-CM

## 2020-06-29 DIAGNOSIS — Z9989 Dependence on other enabling machines and devices: Secondary | ICD-10-CM

## 2020-06-29 NOTE — Patient Instructions (Signed)
Medication Instructions:  Your physician recommends that you continue on your current medications as directed. Please refer to the Current Medication list given to you today.  *If you need a refill on your cardiac medications before your next appointment, please call your pharmacy*  Follow-Up: At [facility], you and your health needs are our priority.  As part of our continuing mission to provide you with exceptional heart care, we have created designated Provider Care Teams.  These Care Teams include your primary Cardiologist (physician) and Advanced Practice Providers (APPs -  Physician Assistants and Nurse Practitioners) who all work together to provide you with the care you need, when you need it.    Your next appointment:   2 month(s)  The format for your next appointment:   In Person  Provider:   Fransico Him, MD

## 2020-06-29 NOTE — Telephone Encounter (Signed)
-----   Message from Sueanne Margarita, MD sent at 06/29/2020  3:16 PM EST ----- CPAP was suppposed to be changed from auto 4-18cm H2O back to 4-13cm H2 and and instead it was changed to 13cm H2O  Please have DME change back to 4-13cm H2o on auto

## 2020-06-29 NOTE — Progress Notes (Addendum)
Virtual Visit via Video Note   This visit type was conducted due to national recommendations for restrictions regarding the COVID-19 Pandemic (e.g. social distancing) in an effort to limit this patient's exposure and mitigate transmission in our community.  Due to his co-morbid illnesses, this patient is at least at moderate risk for complications without adequate follow up.  This format is felt to be most appropriate for this patient at this time.  The patient did not have access to video technology/had technical difficulties with video requiring transitioning to audio format only (telephone).  All issues noted in this document were discussed and addressed.  No physical exam could be performed with this format.  Please refer to the patient's chart for his  consent to telehealth for [facility].   Evaluation Performed:  Follow-up visit  This visit type was conducted due to national recommendations for restrictions regarding the COVID-19 Pandemic (e.g. social distancing).  This format is felt to be most appropriate for this patient at this time.  All issues noted in this document were discussed and addressed.  No physical exam was performed (except for noted visual exam findings with Video Visits).  Please refer to the patient's chart (MyChart message for video visits and phone note for telephone visits) for the patient's consent to telehealth for [facility].  Date:  09/16/2020   ID:  Zachary Pelt., DOB 11/01/1950, MRN [id number]  Patient Location:  Home  Provider location:   [place]  PCP:  Lujean Amel, MD  Sleep Medicine:  Fransico Him, MD  Electrophysiologist:  None   Chief Complaint:  OSA  History of Present Illness:    Zachary Lamere. is a 70 y.o. male who presents via audio/video conferencing for a telehealth visit today.    This is a 70yo male with a history of OSA who has been on CPAP therapy. When he was last seen since he was taking his mask off at  night because the pressure does not feel right.  He was changed to auto CPAP from 4-18cm H2O and changed his mask to a Dreamwear under the nose FFM.  He did not tolerate the 4-18cm H2O and requested to be put back to 4 to 13cm H2O.    He is now here for followup.  He uses it every night and sleeps with it until he cannot breathe and then he takes it off.   He tolerates the mask but the pressure was never changed to 4 to 13cm H2O and was changed to 13cm H2O.   The patient does not have symptoms concerning for COVID-19 infection (fever, chills, cough, or new shortness of breath).   Prior CV studies:   The following studies were reviewed today:  PAP compliance download  Past Medical History:  Diagnosis Date  . Adenomatous colon polyp    Dr Watt Climes 2004  . Anxiety    social   . Arthritis   . Depression    at one point in his life  . GERD (gastroesophageal reflux disease)    occasionally, take OTC meds x 14 days  . Gout of big toe    in right foot  . Hemorrhoid   . Hypercholesterolemia   . Kidney stones, calcium oxalate   . OSA on CPAP   . Ruptured lumbar disc 2017   treated with - epidural injection, , treated with Dr. Rolena Infante & Ramos  . Seasonal allergies   . Wears glasses    Past Surgical History:  Procedure Laterality Date  . COLONOSCOPY    . ETHMOIDECTOMY N/A 04/03/2013   Procedure: ETHMOIDECTOMY;  Surgeon: Izora Gala, MD;  Location: [place];  Service: ENT;  Laterality: N/A;  . FINGER TENDON REPAIR  2008   and arteries-lt middle finger  . KIDNEY STONE SURGERY  1996   calcium oxalate kidney stone  . MASS EXCISION Left 09/10/2018   Procedure: Left long finger debridement and drainage, mass removal;  Surgeon: Iran Planas, MD;  Location: [place];  Service: Orthopedics;  Laterality: Left;  49min  . NASAL SINUS SURGERY  1996  . POLYPECTOMY Bilateral 04/03/2013   Procedure: BILATERAL NASAL POLYPECTOMY ;  Surgeon: Izora Gala, MD;  Location:  [place];  Service: ENT;  Laterality: Bilateral;  . TOTAL SHOULDER ARTHROPLASTY Left 03/09/2016   Procedure: LEFT TOTAL SHOULDER ARTHROPLASTY;  Surgeon: Netta Cedars, MD;  Location: [place];  Service: Orthopedics;  Laterality: Left;     Current Meds  Medication Sig  . allopurinol (ZYLOPRIM) 100 MG tablet Take 100 mg by mouth daily.  Marland Kitchen aspirin 81 MG tablet Take 81 mg by mouth daily.  Marland Kitchen azelastine (ASTELIN) 137 MCG/SPRAY nasal spray Place 2 sprays into both nostrils 2 (two) times daily as needed for allergies. Use in each nostril as directed  . carisoprodol (SOMA) 350 MG tablet Take 350 mg by mouth daily as needed for muscle spasms. Take as directed  . colchicine 0.6 MG tablet Take 0.6 mg by mouth 2 (two) times daily as needed (gout flare up). Gout flare up  . diclofenac sodium (VOLTAREN) 1 % GEL Apply 2 g topically daily as needed (pain).  . Multiple Vitamin (MULTIVITAMIN) tablet Take 1 tablet by mouth daily.  Marland Kitchen VITAMIN D PO Take 4,000 Units by mouth daily.  . [DISCONTINUED] sildenafil (REVATIO) 20 MG tablet Take 20-40 mg by mouth as needed (30 minutes prior to sexual intercourse). (Patient not taking: Reported on 09/07/2020)     Allergies:   Wasp venom, Percocet [oxycodone-acetaminophen], and Penicillins   Social History   Tobacco Use  . Smoking status: Never Smoker  . Smokeless tobacco: Never Used  Vaping Use  . Vaping Use: Never used  Substance Use Topics  . Alcohol use: Yes    Comment: 2 drinks daily - lessening intake in past 2 mths.   . Drug use: No     Family Hx: The patient's family history includes Alzheimer's disease in his father; Hypertension in his sister; Leukemia in his mother.  ROS:   Please see the history of present illness.     All other systems reviewed and are negative.   Labs/Other Tests and Data Reviewed:    Recent Labs: No results found for requested labs within last 8760 hours.   Recent Lipid Panel No results found for: CHOL, TRIG,  HDL, CHOLHDL, LDLCALC, LDLDIRECT  Wt Readings from Last 3 Encounters:  09/07/20 179 lb 3.2 oz (81.3 kg)  09/14/19 180 lb (81.6 kg)  09/12/18 179 lb (81.2 kg)     Objective:    Vital Signs:  Ht 5\' 9"  (1.753 m)   BMI 26.58 kg/m    Well nourished, well developed male in no acute distress. Well appearing, alert and conversant, regular work of breathing,  good skin color  Eyes- anicteric mouth- oral mucosa is pink  neuro- grossly intact skin- no apparent rash or lesions or cyanosis   ASSESSMENT & PLAN:    1.  OSA -  The patient is tolerating PAP  therapy well without any problems. The PAP download was reviewed today and showed an AHI of 4/hr on 13 cm H2O with 3% compliance in using more than 4 hours nightly.  The patient has been using and benefiting from PAP use and will continue to benefit from therapy.  -He was supposed to be on auto 4-13cm H2O and not 13cm H2O so I will call them to have this changed back to 4-13cm H2O -I have encouraged him to be more compliant with his device  2.  Medical entry of CKD in History -he is very concerned about this entry in his medical record and how it may affect getting insurance going forward -I explained at length that this was extrapolated from an entry in his PCP medical records from an OV note with labs in 2015 showing a bump in SCR that was c/w CKD stage 3a.   -I explained that this is important in his note in that he likely has some borderline renal function that was worsened with the NSAIDS and is important information to keep so that if he needs any medication or studies using contrast  in the future we need to be aware of any effects it could have on kidney function -he know that NSAIDs can affect the kidneys and he also needs to stay hydrated  COVID-19 Education: The signs and symptoms of COVID-19 were discussed with the patient and how to seek care for testing (follow up with PCP or arrange E-visit).  The importance of social distancing  was discussed today.  Patient Risk:   After full review of this patient's clinical status, I feel that they are at least moderate risk at this time.  Time:   Today, I have spent 30 minutes on telemedicine discussing medical problems including OSA and reviewing patient's chart including PAP compliance download.  We also talked at length about the medical record in the chart regarding his CKD entry in his medical history.   Medication Adjustments/Labs and Tests Ordered: Current medicines are reviewed at length with the patient today.  Concerns regarding medicines are outlined above.  Tests Ordered: No orders of the defined types were placed in this encounter.  Medication Changes: No orders of the defined types were placed in this encounter.   Disposition:  Follow up in 8 weeks  Signed, Fransico Him, MD  09/16/2020 10:47 AM    [place]

## 2020-06-29 NOTE — Telephone Encounter (Signed)
Order placed to [facility] for 4-13 cm H20

## 2020-07-20 DIAGNOSIS — H6123 Impacted cerumen, bilateral: Secondary | ICD-10-CM | POA: Diagnosis not present

## 2020-08-10 DIAGNOSIS — D044 Carcinoma in situ of skin of scalp and neck: Secondary | ICD-10-CM | POA: Diagnosis not present

## 2020-09-06 NOTE — Progress Notes (Addendum)
Date:  09/16/2020   ID:  Zachary Pelt., DOB May 29, 1950, MRN [id number]  PCP:  Lujean Amel, MD  Sleep Medicine:  Fransico Him, MD  Electrophysiologist:  None   Chief Complaint:  OSA  History of Present Illness:    Zachary Kindle. is a 70 y.o. male with a history of OSA who has been on CPAP therapy.  At last OV his CPAP was changed from a set pressure of 13cm H2O to auto CPAP from 4 to 13cm H2O.    He is doing well with his CPAP device but has not been compliant.  He did not use his PAP for 10 days when he went on vacation. He then had a tooth pulled and was concerned that he would get a dry socket if he used his device so he did not use it for another 10 days.  He says that he puts the device on but 2-3 hours later he wakes up gasping for breath and is not sure if his machine is not working right or if he is having apneas.  He has a hx of nasal polyps resected twice and is concerned that he may have more polyps.  He follows with Dr. Constance Holster.  Prior CV studies:   The following studies were reviewed today:  PAP compliance download  Past Medical History:  Diagnosis Date   Adenomatous colon polyp    Dr Watt Climes 2004   Anxiety    social    Arthritis    Depression    at one point in his life   GERD (gastroesophageal reflux disease)    occasionally, take OTC meds x 14 days   Gout of big toe    in right foot   Hemorrhoid    Hypercholesterolemia    Kidney stones, calcium oxalate    OSA on CPAP    Ruptured lumbar disc 2017   treated with - epidural injection, , treated with Dr. Rolena Infante & Ramos   Seasonal allergies    Wears glasses    Past Surgical History:  Procedure Laterality Date   COLONOSCOPY     ETHMOIDECTOMY N/A 04/03/2013   Procedure: ETHMOIDECTOMY;  Surgeon: Izora Gala, MD;  Location: [place];  Service: ENT;  Laterality: N/A;   [place]  2008   and arteries-lt middle finger   KIDNEY STONE SURGERY  1996   calcium oxalate  kidney stone   MASS EXCISION Left 09/10/2018   Procedure: Left long finger debridement and drainage, mass removal;  Surgeon: Iran Planas, MD;  Location: [place];  Service: Orthopedics;  Laterality: Left;  56min   NASAL SINUS SURGERY  1996   POLYPECTOMY Bilateral 04/03/2013   Procedure: BILATERAL NASAL POLYPECTOMY ;  Surgeon: Izora Gala, MD;  Location: [place];  Service: ENT;  Laterality: Bilateral;   TOTAL SHOULDER ARTHROPLASTY Left 03/09/2016   Procedure: LEFT TOTAL SHOULDER ARTHROPLASTY;  Surgeon: Netta Cedars, MD;  Location: [place];  Service: Orthopedics;  Laterality: Left;     Current Meds  Medication Sig   allopurinol (ZYLOPRIM) 100 MG tablet Take 100 mg by mouth daily.   aspirin 81 MG tablet Take 81 mg by mouth daily.   azelastine (ASTELIN) 137 MCG/SPRAY nasal spray Place 2 sprays into both nostrils 2 (two) times daily as needed for allergies. Use in each nostril as directed   carisoprodol (SOMA) 350 MG tablet Take 350 mg by mouth daily as needed for muscle spasms. Take as  directed   colchicine 0.6 MG tablet Take 0.6 mg by mouth 2 (two) times daily as needed (gout flare up). Gout flare up   diclofenac sodium (VOLTAREN) 1 % GEL Apply 2 g topically daily as needed (pain).   Multiple Vitamin (MULTIVITAMIN) tablet Take 1 tablet by mouth daily.   Triprolidine-Pseudoephedrine (ANTIHISTAMINE PO) Take 1 tablet by mouth as directed.   VITAMIN D PO Take 4,000 Units by mouth daily.     Allergies:   Wasp venom, Percocet [oxycodone-acetaminophen], and Penicillins   Social History   Tobacco Use   Smoking status: Never Smoker   Smokeless tobacco: Never Used  Vaping Use   Vaping Use: Never used  Substance Use Topics   Alcohol use: Yes    Comment: 2 drinks daily - lessening intake in past 2 mths.    Drug use: No     Family Hx: The patient's family history includes Alzheimer's disease in his father; Hypertension in his sister; Leukemia in his  mother.  ROS:   Please see the history of present illness.     All other systems reviewed and are negative.   Labs/Other Tests and Data Reviewed:    Recent Labs: No results found for requested labs within last 8760 hours.   Recent Lipid Panel No results found for: CHOL, TRIG, HDL, CHOLHDL, LDLCALC, LDLDIRECT  Wt Readings from Last 3 Encounters:  09/07/20 179 lb 3.2 oz (81.3 kg)  09/14/19 180 lb (81.6 kg)  09/12/18 179 lb (81.2 kg)     Objective:    Vital Signs:  BP 132/76   Pulse 63   Ht 5\' 9"  (1.753 m)   Wt 179 lb 3.2 oz (81.3 kg)   SpO2 94%   BMI 26.46 kg/m    GEN: Well nourished, well developed in no acute distress HEENT: Normal NECK: No JVD; No carotid bruits LYMPHATICS: No lymphadenopathy CARDIAC:RRR, no murmurs, rubs, gallops RESPIRATORY:  Clear to auscultation without rales, wheezing or rhonchi  ABDOMEN: Soft, non-tender, non-distended MUSCULOSKELETAL:  No edema; No deformity  SKIN: Warm and dry NEUROLOGIC:  Alert and oriented x 3 PSYCHIATRIC:  Normal affect    ASSESSMENT & PLAN:    1.  OSA - The patient is tolerating PAP therapy. The PAP download was reviewed today and showed an AHI of 3.6/hr on 13 cm H2O with 3% compliance in using more than 4 hours nightly.  The patient has been using and benefiting from PAP use and will continue to benefit from therapy.  -for some reason his device is still on a set pressure despite sending orders for auto PAP >> he does not think it has an auto setting -after discussion with the DME - the patient does not have a device that is capable for auto CPAP so will need to order a a CPAP titration to get him a new devie  2.  Nasal polyps  -he has had resections twice and is concerned that he may have more polyps -I will refer him back to Dr. Constance Holster   Medication Adjustments/Labs and Tests Ordered: Current medicines are reviewed at length with the patient today.  Concerns regarding medicines are outlined above.  Tests  Ordered: Orders Placed This Encounter  Procedures   Ambulatory referral to ENT   Medication Changes: No orders of the defined types were placed in this encounter.   Disposition:  Follow up in 6 weeks  Signed, Fransico Him, MD  09/16/2020 10:42 AM    [place]

## 2020-09-07 ENCOUNTER — Other Ambulatory Visit: Payer: Self-pay

## 2020-09-07 ENCOUNTER — Encounter: Payer: Self-pay | Admitting: Cardiology

## 2020-09-07 ENCOUNTER — Telehealth: Payer: PPO | Admitting: Cardiology

## 2020-09-07 VITALS — BP 132/76 | HR 63 | Ht 69.0 in | Wt 179.2 lb

## 2020-09-07 DIAGNOSIS — G4733 Obstructive sleep apnea (adult) (pediatric): Secondary | ICD-10-CM | POA: Diagnosis not present

## 2020-09-07 DIAGNOSIS — J339 Nasal polyp, unspecified: Secondary | ICD-10-CM | POA: Diagnosis not present

## 2020-09-07 DIAGNOSIS — Z9989 Dependence on other enabling machines and devices: Secondary | ICD-10-CM | POA: Diagnosis not present

## 2020-09-07 NOTE — Patient Instructions (Addendum)
Medication Instructions:  Your physician recommends that you continue on your current medications as directed. Please refer to the Current Medication list given to you today.  *If you need a refill on your cardiac medications before your next appointment, please call your pharmacy*  Follow-Up: At [facility], you and your health needs are our priority.  As part of our continuing mission to provide you with exceptional heart care, we have created designated Provider Care Teams.  These Care Teams include your primary Cardiologist (physician) and Advanced Practice Providers (APPs -  Physician Assistants and Nurse Practitioners) who all work together to provide you with the care you need, when you need it.  Your next appointment:   1 year   The format for your next appointment:   In Person  Provider:   Fransico Him, MD

## 2020-09-07 NOTE — Addendum Note (Signed)
Addended by: Antonieta Iba on: 09/07/2020 10:28 AM   Modules accepted: Orders

## 2020-09-14 NOTE — Telephone Encounter (Signed)
Per dr Radford Pax patient has been referred for a cpap titration. Informed patient of sleep study results and patient understanding was verbalized.  Pt is aware of his titration Titration sent to sleep pool.

## 2020-09-16 ENCOUNTER — Encounter: Payer: Self-pay | Admitting: Cardiology

## 2020-09-22 NOTE — Telephone Encounter (Signed)
I don't do the prior authorizations but I do know insurance has 15 business days (not including weekends) to give a denial or approval. The insurance department will contact me when they have an answer and I will contact you.

## 2020-10-28 NOTE — Telephone Encounter (Addendum)
Started working on the prior auth for patient but needed dr Radford Pax to adden her note to order the titration. Spoke to [facility] and they suggested that he do a split night study since he is out of his compliance period or he could purchase a auto unit for $200 dollars until he is eligible with his insurance to get a new auto unit in may of 2023. Patient was interested in doing that and said he was going to call [employer] for the details and call our office back.

## 2020-11-04 NOTE — Telephone Encounter (Signed)
Patient called back to say he will purchase a auto unit for $200 dollars until he is eligible with his insurance to get a new auto unit in may of 2023.

## 2020-11-08 DIAGNOSIS — H938X3 Other specified disorders of ear, bilateral: Secondary | ICD-10-CM | POA: Diagnosis not present

## 2020-11-08 DIAGNOSIS — Z9989 Dependence on other enabling machines and devices: Secondary | ICD-10-CM | POA: Diagnosis not present

## 2020-11-08 DIAGNOSIS — G4733 Obstructive sleep apnea (adult) (pediatric): Secondary | ICD-10-CM | POA: Diagnosis not present

## 2020-11-11 NOTE — Telephone Encounter (Signed)
Per Ivin Booty at [facility] patient purchased an auto unit and [facility] was able to set his pressures to auto.

## 2020-11-29 DIAGNOSIS — E7800 Pure hypercholesterolemia, unspecified: Secondary | ICD-10-CM | POA: Diagnosis not present

## 2020-11-29 DIAGNOSIS — R7309 Other abnormal glucose: Secondary | ICD-10-CM | POA: Diagnosis not present

## 2020-11-29 DIAGNOSIS — Z0001 Encounter for general adult medical examination with abnormal findings: Secondary | ICD-10-CM | POA: Diagnosis not present

## 2020-11-29 DIAGNOSIS — Z79899 Other long term (current) drug therapy: Secondary | ICD-10-CM | POA: Diagnosis not present

## 2020-11-29 DIAGNOSIS — M109 Gout, unspecified: Secondary | ICD-10-CM | POA: Diagnosis not present

## 2020-11-29 DIAGNOSIS — Z125 Encounter for screening for malignant neoplasm of prostate: Secondary | ICD-10-CM | POA: Diagnosis not present

## 2020-12-01 DIAGNOSIS — E7800 Pure hypercholesterolemia, unspecified: Secondary | ICD-10-CM | POA: Diagnosis not present

## 2020-12-01 DIAGNOSIS — R7309 Other abnormal glucose: Secondary | ICD-10-CM | POA: Diagnosis not present

## 2020-12-01 DIAGNOSIS — Z0000 Encounter for general adult medical examination without abnormal findings: Secondary | ICD-10-CM | POA: Diagnosis not present

## 2020-12-01 DIAGNOSIS — M109 Gout, unspecified: Secondary | ICD-10-CM | POA: Diagnosis not present

## 2020-12-05 ENCOUNTER — Other Ambulatory Visit (HOSPITAL_COMMUNITY): Payer: Self-pay | Admitting: Family Medicine

## 2020-12-15 ENCOUNTER — Other Ambulatory Visit: Payer: Self-pay

## 2020-12-15 ENCOUNTER — Ambulatory Visit (HOSPITAL_BASED_OUTPATIENT_CLINIC_OR_DEPARTMENT_OTHER)
Admission: RE | Admit: 2020-12-15 | Discharge: 2020-12-15 | Disposition: A | Discharge status: Home / Self Care | Payer: PPO | Source: Ambulatory Visit | Attending: Family Medicine | Admitting: Family Medicine

## 2020-12-15 DIAGNOSIS — I77810 Thoracic aortic ectasia: Secondary | ICD-10-CM | POA: Insufficient documentation

## 2020-12-15 DIAGNOSIS — E7800 Pure hypercholesterolemia, unspecified: Secondary | ICD-10-CM | POA: Insufficient documentation

## 2020-12-15 DIAGNOSIS — I348 Other nonrheumatic mitral valve disorders: Secondary | ICD-10-CM | POA: Insufficient documentation

## 2020-12-30 ENCOUNTER — Other Ambulatory Visit (HOSPITAL_COMMUNITY): Payer: Self-pay | Admitting: Family Medicine

## 2020-12-30 DIAGNOSIS — I77810 Thoracic aortic ectasia: Secondary | ICD-10-CM

## 2021-01-04 ENCOUNTER — Encounter (HOSPITAL_COMMUNITY): Payer: Self-pay | Admitting: Family Medicine

## 2021-01-09 DIAGNOSIS — G4733 Obstructive sleep apnea (adult) (pediatric): Secondary | ICD-10-CM | POA: Diagnosis not present

## 2021-01-25 ENCOUNTER — Other Ambulatory Visit: Payer: Self-pay

## 2021-01-25 ENCOUNTER — Ambulatory Visit (HOSPITAL_COMMUNITY): Payer: PPO | Attending: Cardiovascular Disease

## 2021-01-25 DIAGNOSIS — I77810 Thoracic aortic ectasia: Secondary | ICD-10-CM

## 2021-01-25 DIAGNOSIS — E785 Hyperlipidemia, unspecified: Secondary | ICD-10-CM | POA: Diagnosis not present

## 2021-01-25 DIAGNOSIS — I340 Nonrheumatic mitral (valve) insufficiency: Secondary | ICD-10-CM | POA: Insufficient documentation

## 2021-01-25 LAB — ECHOCARDIOGRAM COMPLETE
Area-P 1/2: 2.47 cm2
S' Lateral: 2.6 cm

## 2021-02-08 DIAGNOSIS — Z23 Encounter for immunization: Secondary | ICD-10-CM | POA: Diagnosis not present

## 2021-02-15 DIAGNOSIS — H2513 Age-related nuclear cataract, bilateral: Secondary | ICD-10-CM | POA: Diagnosis not present

## 2021-02-15 DIAGNOSIS — H5213 Myopia, bilateral: Secondary | ICD-10-CM | POA: Diagnosis not present

## 2021-02-15 DIAGNOSIS — H25013 Cortical age-related cataract, bilateral: Secondary | ICD-10-CM | POA: Diagnosis not present

## 2021-04-11 DIAGNOSIS — H25812 Combined forms of age-related cataract, left eye: Secondary | ICD-10-CM | POA: Diagnosis not present

## 2021-04-11 DIAGNOSIS — H25012 Cortical age-related cataract, left eye: Secondary | ICD-10-CM | POA: Diagnosis not present

## 2021-04-11 DIAGNOSIS — H2512 Age-related nuclear cataract, left eye: Secondary | ICD-10-CM | POA: Diagnosis not present

## 2021-04-24 DIAGNOSIS — L819 Disorder of pigmentation, unspecified: Secondary | ICD-10-CM | POA: Diagnosis not present

## 2021-04-24 DIAGNOSIS — L814 Other melanin hyperpigmentation: Secondary | ICD-10-CM | POA: Diagnosis not present

## 2021-04-24 DIAGNOSIS — Z23 Encounter for immunization: Secondary | ICD-10-CM | POA: Diagnosis not present

## 2021-04-24 DIAGNOSIS — L578 Other skin changes due to chronic exposure to nonionizing radiation: Secondary | ICD-10-CM | POA: Diagnosis not present

## 2021-04-24 DIAGNOSIS — D485 Neoplasm of uncertain behavior of skin: Secondary | ICD-10-CM | POA: Diagnosis not present

## 2021-04-24 DIAGNOSIS — L821 Other seborrheic keratosis: Secondary | ICD-10-CM | POA: Diagnosis not present

## 2021-04-24 DIAGNOSIS — L820 Inflamed seborrheic keratosis: Secondary | ICD-10-CM | POA: Diagnosis not present

## 2021-04-24 DIAGNOSIS — D225 Melanocytic nevi of trunk: Secondary | ICD-10-CM | POA: Diagnosis not present

## 2021-04-24 DIAGNOSIS — Z85828 Personal history of other malignant neoplasm of skin: Secondary | ICD-10-CM | POA: Diagnosis not present

## 2021-05-23 DIAGNOSIS — H2511 Age-related nuclear cataract, right eye: Secondary | ICD-10-CM | POA: Diagnosis not present

## 2021-05-23 DIAGNOSIS — H25011 Cortical age-related cataract, right eye: Secondary | ICD-10-CM | POA: Diagnosis not present

## 2021-05-23 DIAGNOSIS — H25811 Combined forms of age-related cataract, right eye: Secondary | ICD-10-CM | POA: Diagnosis not present

## 2021-06-06 DIAGNOSIS — G4733 Obstructive sleep apnea (adult) (pediatric): Secondary | ICD-10-CM | POA: Diagnosis not present

## 2021-06-30 DIAGNOSIS — Z961 Presence of intraocular lens: Secondary | ICD-10-CM | POA: Diagnosis not present

## 2021-09-14 DIAGNOSIS — G4733 Obstructive sleep apnea (adult) (pediatric): Secondary | ICD-10-CM | POA: Diagnosis not present

## 2021-09-25 DIAGNOSIS — H6122 Impacted cerumen, left ear: Secondary | ICD-10-CM | POA: Diagnosis not present

## 2021-11-06 DIAGNOSIS — S20462A Insect bite (nonvenomous) of left back wall of thorax, initial encounter: Secondary | ICD-10-CM | POA: Diagnosis not present

## 2021-11-06 DIAGNOSIS — W57XXXA Bitten or stung by nonvenomous insect and other nonvenomous arthropods, initial encounter: Secondary | ICD-10-CM | POA: Diagnosis not present

## 2021-11-16 ENCOUNTER — Telehealth: Payer: Self-pay | Admitting: Cardiology

## 2021-11-16 NOTE — Telephone Encounter (Signed)
What problem are you experiencing? Patient states that he is past due for his yearly f/u and needs to have a new authorization for his CPAP. He states that [employer] is telling him there is a 2 month waiting period right now and he wants to go ahead and get on the list. He is scheduled to see Dr. Radford Pax on 01/24/22 for his yearly f/u. Can this authorization be sent in prior to his visit?  Who is your medical equipment company? [employer]   Please route to the sleep study assistant.

## 2021-11-16 NOTE — Telephone Encounter (Signed)
Per [facility] there is nothing for [place] to do the patient has to wait until his approval comes back from his insurance company.

## 2021-12-05 DIAGNOSIS — E7800 Pure hypercholesterolemia, unspecified: Secondary | ICD-10-CM | POA: Diagnosis not present

## 2021-12-05 DIAGNOSIS — Z79899 Other long term (current) drug therapy: Secondary | ICD-10-CM | POA: Diagnosis not present

## 2021-12-05 DIAGNOSIS — Z125 Encounter for screening for malignant neoplasm of prostate: Secondary | ICD-10-CM | POA: Diagnosis not present

## 2021-12-05 DIAGNOSIS — Z0001 Encounter for general adult medical examination with abnormal findings: Secondary | ICD-10-CM | POA: Diagnosis not present

## 2021-12-05 DIAGNOSIS — R7309 Other abnormal glucose: Secondary | ICD-10-CM | POA: Diagnosis not present

## 2021-12-05 DIAGNOSIS — R7301 Impaired fasting glucose: Secondary | ICD-10-CM | POA: Diagnosis not present

## 2021-12-07 DIAGNOSIS — R7309 Other abnormal glucose: Secondary | ICD-10-CM | POA: Diagnosis not present

## 2021-12-07 DIAGNOSIS — I77810 Thoracic aortic ectasia: Secondary | ICD-10-CM | POA: Diagnosis not present

## 2021-12-07 DIAGNOSIS — T148XXA Other injury of unspecified body region, initial encounter: Secondary | ICD-10-CM | POA: Diagnosis not present

## 2021-12-07 DIAGNOSIS — R059 Cough, unspecified: Secondary | ICD-10-CM | POA: Diagnosis not present

## 2021-12-07 DIAGNOSIS — M109 Gout, unspecified: Secondary | ICD-10-CM | POA: Diagnosis not present

## 2021-12-07 DIAGNOSIS — E7800 Pure hypercholesterolemia, unspecified: Secondary | ICD-10-CM | POA: Diagnosis not present

## 2021-12-07 DIAGNOSIS — Z0000 Encounter for general adult medical examination without abnormal findings: Secondary | ICD-10-CM | POA: Diagnosis not present

## 2021-12-19 DIAGNOSIS — L570 Actinic keratosis: Secondary | ICD-10-CM | POA: Diagnosis not present

## 2021-12-19 DIAGNOSIS — L820 Inflamed seborrheic keratosis: Secondary | ICD-10-CM | POA: Diagnosis not present

## 2021-12-19 DIAGNOSIS — L708 Other acne: Secondary | ICD-10-CM | POA: Diagnosis not present

## 2021-12-19 DIAGNOSIS — L821 Other seborrheic keratosis: Secondary | ICD-10-CM | POA: Diagnosis not present

## 2022-01-11 DIAGNOSIS — H903 Sensorineural hearing loss, bilateral: Secondary | ICD-10-CM | POA: Diagnosis not present

## 2022-01-11 DIAGNOSIS — H9113 Presbycusis, bilateral: Secondary | ICD-10-CM | POA: Diagnosis not present

## 2022-01-24 ENCOUNTER — Encounter: Payer: Self-pay | Admitting: Cardiology

## 2022-01-24 ENCOUNTER — Ambulatory Visit: Payer: PPO | Attending: Cardiology | Admitting: Cardiology

## 2022-01-24 VITALS — BP 112/60 | HR 64 | Ht 69.0 in | Wt 174.0 lb

## 2022-01-24 DIAGNOSIS — G4733 Obstructive sleep apnea (adult) (pediatric): Secondary | ICD-10-CM | POA: Diagnosis not present

## 2022-01-24 DIAGNOSIS — Z9989 Dependence on other enabling machines and devices: Secondary | ICD-10-CM

## 2022-01-24 NOTE — Progress Notes (Signed)
Date:  01/24/2022   ID:  Zachary Pelt., DOB [birth year], MRN [id number]  PCP:  Zachary Amel, MD  Sleep Medicine:  Zachary Him, MD  Electrophysiologist:  None   Chief Complaint:  OSA  History of Present Illness:    Zachary Alvarado. is a 71 y.o. male with a history of OSA who has been on CPAP therapy.  At last OV his CPAP was changed from a set pressure of 13cm H2O to auto CPAP from 4 to 13cm H2O.    He is doing well with his PAP device.  He says that he is due to get a new machine and would like one ordered. He tolerates the mask but sometimes will take it off in his sleep without knowing.   Since going on PAP he feels rested in the am and has no significant daytime sleepiness if he sleeps well the night before.  He denies any significant mouth or nasal dryness or nasal congestion.  He does not think that he snores.    Prior CV studies:   The following studies were reviewed today:  PAP compliance download  Past Medical History:  Diagnosis Date   Adenomatous colon polyp    Dr Watt Climes 2004   Anxiety    social    Arthritis    Depression    at one point in his life   GERD (gastroesophageal reflux disease)    occasionally, take OTC meds x 14 days   Gout of big toe    in right foot   Hemorrhoid    Hypercholesterolemia    Kidney stones, calcium oxalate    OSA on CPAP    Ruptured lumbar disc 2017   treated with - epidural injection, , treated with Dr. Rolena Infante & Ramos   Seasonal allergies    Wears glasses    Past Surgical History:  Procedure Laterality Date   COLONOSCOPY     ETHMOIDECTOMY N/A 04/03/2013   Procedure: ETHMOIDECTOMY;  Surgeon: Izora Gala, MD;  Location: [place];  Service: ENT;  Laterality: N/A;   [place]  2008   and arteries-lt middle finger   KIDNEY STONE SURGERY  1996   calcium oxalate kidney stone   MASS EXCISION Left 09/10/2018   Procedure: Left long finger debridement and drainage, mass removal;  Surgeon:  Iran Planas, MD;  Location: [place];  Service: Orthopedics;  Laterality: Left;  74mn   NASAL SINUS SURGERY  1996   POLYPECTOMY Bilateral 04/03/2013   Procedure: BILATERAL NASAL POLYPECTOMY ;  Surgeon: JIzora Gala MD;  Location[place]  Service: ENT;  Laterality: Bilateral;   TOTAL SHOULDER ARTHROPLASTY Left 03/09/2016   Procedure: LEFT TOTAL SHOULDER ARTHROPLASTY;  Surgeon: SNetta Cedars MD;  [place]  Service: Orthopedics;  Laterality: Left;     Current Meds  Medication Sig   allopurinol (ZYLOPRIM) 100 MG tablet Take 100 mg by mouth daily.   aspirin 81 MG tablet Take 81 mg by mouth daily.   azelastine (ASTELIN) 137 MCG/SPRAY nasal spray Place 2 sprays into both nostrils 2 (two) times daily as needed for allergies. Use in each nostril as directed   diclofenac sodium (VOLTAREN) 1 % GEL Apply 2 g topically daily as needed (pain).   Multiple Vitamin (MULTIVITAMIN) tablet Take 1 tablet by mouth daily.   rosuvastatin (CRESTOR) 5 MG tablet Take 5 mg by mouth daily.   Triprolidine-Pseudoephedrine (ANTIHISTAMINE PO) Take 1 tablet by mouth as directed.  VITAMIN D PO Take 4,000 Units by mouth daily.     Allergies:   Wasp venom, Percocet [oxycodone-acetaminophen], and Penicillins   Social History   Tobacco Use   Smoking status: Never   Smokeless tobacco: Never  Vaping Use   Vaping Use: Never used  Substance Use Topics   Alcohol use: Yes    Comment: 2 drinks daily - lessening intake in past 2 mths.    Drug use: No     Family Hx: The patient's family history includes Alzheimer's disease in his father; Hypertension in his sister; Leukemia in his mother.  ROS:   Please see the history of present illness.     All other systems reviewed and are negative.   Labs/Other Tests and Data Reviewed:    Recent Labs: No results found for requested labs within last 365 days.   Recent Lipid Panel No results found for: "CHOL", "TRIG", "HDL",  "CHOLHDL", "LDLCALC", "LDLDIRECT"  Wt Readings from Last 3 Encounters:  01/24/22 174 lb (78.9 kg)  09/07/20 179 lb 3.2 oz (81.3 kg)  09/14/19 180 lb (81.6 kg)     Objective:    Vital Signs:  BP 112/60 (BP Location: Left Arm, Patient Position: Sitting, Cuff Size: Normal)   Pulse 64   Ht '5\' 9"'$  (1.753 m)   Wt 174 lb (78.9 kg)   SpO2 98%   BMI 25.70 kg/m    GEN: Well nourished, well developed in no acute distress HEENT: Normal NECK: No JVD; No carotid bruits LYMPHATICS: No lymphadenopathy CARDIAC:RRR, no murmurs, rubs, gallops RESPIRATORY:  Clear to auscultation without rales, wheezing or rhonchi  ABDOMEN: Soft, non-tender, non-distended MUSCULOSKELETAL:  No edema; No deformity  SKIN: Warm and dry NEUROLOGIC:  Alert and oriented x 3 PSYCHIATRIC:  Normal affect   ASSESSMENT & PLAN:    1. OSA - The patient is tolerating PAP therapy well without any problems. The PAP download performed by his DME was personally reviewed and interpreted by me today and showed an AHI of 9.2 /hr on auto CPAP from 4-13 cm H2O with 80% compliance in using more than 4 hours nightly. -His AHI is too high so I am going to increase his auto CPAP to 4 to 20 cm H2O  -I will order Alvarado a new Resmed auto CPAP from 4 to 20cm H2O and will see Alvarado back 6 weeks after he gets his new device.   Medication Adjustments/Labs and Tests Ordered: Current medicines are reviewed at length with the patient today.  Concerns regarding medicines are outlined above.  Tests Ordered: No orders of the defined types were placed in this encounter.  Medication Changes: No orders of the defined types were placed in this encounter.   Disposition:  Follow up in 6 weeks  Signed, Zachary Him, MD  01/24/2022 9:40 AM    [place]

## 2022-01-24 NOTE — Patient Instructions (Signed)
Medication Instructions:  Your physician recommends that you continue on your current medications as directed. Please refer to the Current Medication list given to you today.  *If you need a refill on your cardiac medications before your next appointment, please call your pharmacy*  Follow-Up: At [facility], you and your health needs are our priority.  As part of our continuing mission to provide you with exceptional heart care, we have created designated Provider Care Teams.  These Care Teams include your primary Cardiologist (physician) and Advanced Practice Providers (APPs -  Physician Assistants and Nurse Practitioners) who all work together to provide you with the care you need, when you need it.  Follow up with Dr. Radford Pax after receiving your new machine  Other Instructions Dr. Radford Pax ordered you a new Resmed auto CPAP from 4 to 20cm H2O   Important Information About Sugar

## 2022-01-26 ENCOUNTER — Telehealth: Payer: Self-pay | Admitting: *Deleted

## 2022-01-26 DIAGNOSIS — G4733 Obstructive sleep apnea (adult) (pediatric): Secondary | ICD-10-CM

## 2022-01-26 NOTE — Telephone Encounter (Signed)
[facility] to [facility] via fax.

## 2022-01-26 NOTE — Telephone Encounter (Signed)
-----   Message from Antonieta Iba, RN sent at 01/24/2022  9:47 AM EDT ----- Per Dr. Radford Pax: Please order him a new Resmed auto CPAP from 4 to 20cm H2O and will see him back 6 weeks after he gets his new device. Once you have placed the order, please send the patient a MyChart message to let him know that he order has been placed.  Thanks!

## 2022-01-30 DIAGNOSIS — Z23 Encounter for immunization: Secondary | ICD-10-CM | POA: Diagnosis not present

## 2022-02-09 DIAGNOSIS — G4733 Obstructive sleep apnea (adult) (pediatric): Secondary | ICD-10-CM | POA: Diagnosis not present

## 2022-04-05 ENCOUNTER — Telehealth: Payer: Self-pay | Admitting: Cardiology

## 2022-04-05 ENCOUNTER — Encounter: Payer: Self-pay | Admitting: Cardiology

## 2022-04-05 NOTE — Telephone Encounter (Signed)
Pt states he would like to know the model of his new CPAP machine. Pt states he wants the S10 auto

## 2022-04-05 NOTE — Telephone Encounter (Signed)
That is normally what the doctor orders. The order was for an  new Resmed auto CPAP from 4 to 20cm H2O.

## 2022-04-11 DIAGNOSIS — H6123 Impacted cerumen, bilateral: Secondary | ICD-10-CM | POA: Diagnosis not present

## 2022-04-20 DIAGNOSIS — G4733 Obstructive sleep apnea (adult) (pediatric): Secondary | ICD-10-CM | POA: Diagnosis not present

## 2022-04-24 ENCOUNTER — Encounter: Payer: Self-pay | Admitting: Cardiology

## 2022-05-03 DIAGNOSIS — G4733 Obstructive sleep apnea (adult) (pediatric): Secondary | ICD-10-CM | POA: Diagnosis not present

## 2022-05-21 DIAGNOSIS — G4733 Obstructive sleep apnea (adult) (pediatric): Secondary | ICD-10-CM | POA: Diagnosis not present

## 2022-05-22 ENCOUNTER — Encounter: Payer: Self-pay | Admitting: Cardiology

## 2022-06-06 ENCOUNTER — Ambulatory Visit: Payer: PPO | Attending: Cardiology | Admitting: Cardiology

## 2022-06-06 ENCOUNTER — Encounter: Payer: Self-pay | Admitting: Cardiology

## 2022-06-06 VITALS — BP 118/72 | HR 70 | Ht 68.0 in | Wt 174.0 lb

## 2022-06-06 DIAGNOSIS — G4733 Obstructive sleep apnea (adult) (pediatric): Secondary | ICD-10-CM

## 2022-06-06 NOTE — Progress Notes (Signed)
Date:  06/06/2022   ID:  Zachary Pelt., DOB 16-Jun-1950, MRN [id number]  PCP:  Lujean Amel, MD  Sleep Medicine:  Fransico Him, MD  Electrophysiologist:  None   Chief Complaint:  OSA  History of Present Illness:    Zachary Alvarado. is a 72 y.o. male with a history of OSA who has been on CPAP therapy.  At last OV his CPAP was changed from a set pressure of 13cm H2O to auto CPAP from 4 to 13cm H2O.  At last OV his AHI was too high so I increased his auto CPAP to 4-20cm H2O.    He is doing well with his PAP device and thinks that he has gotten used to it.  He tolerates the FFM mask and feels the pressure is adequate.  Since going on PAP he feels rested in the am and has no significant daytime sleepiness.  He denies any significant mouth or nasal dryness or nasal congestion.  He does not think that he snores.    Prior CV studies:   The following studies were reviewed today:  PAP compliance download  Past Medical History:  Diagnosis Date   Adenomatous colon polyp    Dr Watt Climes 2004   Anxiety    social    Arthritis    Depression    at one point in his life   GERD (gastroesophageal reflux disease)    occasionally, take OTC meds x 14 days   Gout of big toe    in right foot   Hemorrhoid    Hypercholesterolemia    Kidney stones, calcium oxalate    OSA on CPAP    Ruptured lumbar disc 2017   treated with - epidural injection, , treated with Dr. Rolena Infante & Ramos   Seasonal allergies    Wears glasses    Past Surgical History:  Procedure Laterality Date   COLONOSCOPY     ETHMOIDECTOMY N/A 04/03/2013   Procedure: ETHMOIDECTOMY;  Surgeon: Izora Gala, MD;  Location: [facility];  Service: ENT;  Laterality: N/A;   [place]  2008   and arteries-lt middle finger   KIDNEY STONE SURGERY  1996   calcium oxalate kidney stone   MASS EXCISION Left 09/10/2018   Procedure: Left long finger debridement and drainage, mass removal;  Surgeon: Iran Planas,  MD;  Location: [place];  Service: Orthopedics;  Laterality: Left;  65mn   NASAL SINUS SURGERY  1996   POLYPECTOMY Bilateral 04/03/2013   Procedure: BILATERAL NASAL POLYPECTOMY ;  Surgeon: JIzora Gala MD;  [place]  Service: ENT;  Laterality: Bilateral;   TOTAL SHOULDER ARTHROPLASTY Left 03/09/2016   Procedure: LEFT TOTAL SHOULDER ARTHROPLASTY;  Surgeon: SNetta Cedars MD;  [place] [facility]  Service: Orthopedics;  Laterality: Left;     Current Meds  Medication Sig   allopurinol (ZYLOPRIM) 100 MG tablet Take 100 mg by mouth daily.   aspirin 81 MG tablet Take 81 mg by mouth daily.   azelastine (ASTELIN) 137 MCG/SPRAY nasal spray Place 2 sprays into both nostrils 2 (two) times daily as needed for allergies. Use in each nostril as directed   diclofenac sodium (VOLTAREN) 1 % GEL Apply 2 g topically daily as needed (pain).   Multiple Vitamin (MULTIVITAMIN) tablet Take 1 tablet by mouth daily.   rosuvastatin (CRESTOR) 5 MG tablet Take 5 mg by mouth daily.   tiZANidine (ZANAFLEX) 2 MG tablet Take 2 mg by mouth as  directed.   Triprolidine-Pseudoephedrine (ANTIHISTAMINE PO) Take 1 tablet by mouth as directed.   VITAMIN D PO Take 4,000 Units by mouth daily.     Allergies:   Wasp venom, Percocet [oxycodone-acetaminophen], and Penicillins   Social History   Tobacco Use   Smoking status: Former    Types: Cigarettes   Smokeless tobacco: Never  Vaping Use   Vaping Use: Never used  Substance Use Topics   Alcohol use: Yes    Comment: 2 drinks daily - lessening intake in past 2 mths.    Drug use: No     Family Hx: The patient's family history includes Alzheimer's disease in his father; Hypertension in his sister; Leukemia in his mother.  ROS:   Please see the history of present illness.     All other systems reviewed and are negative.   Labs/Other Tests and Data Reviewed:    Recent Labs: No results found for requested labs within last 365  days.   Recent Lipid Panel No results found for: "CHOL", "TRIG", "HDL", "CHOLHDL", "LDLCALC", "LDLDIRECT"  Wt Readings from Last 3 Encounters:  06/06/22 174 lb (78.9 kg)  01/24/22 174 lb (78.9 kg)  09/07/20 179 lb 3.2 oz (81.3 kg)     Objective:    Vital Signs:  BP 118/72   Pulse 70   Ht '5\' 8"'$  (1.727 m)   Wt 174 lb (78.9 kg)   SpO2 97%   BMI 26.46 kg/m    GEN: Well nourished, well developed in no acute distress HEENT: Normal NECK: No JVD; No carotid bruits LYMPHATICS: No lymphadenopathy CARDIAC:RRR, no murmurs, rubs, gallops RESPIRATORY:  Clear to auscultation without rales, wheezing or rhonchi  ABDOMEN: Soft, non-tender, non-distended MUSCULOSKELETAL:  No edema; No deformity  SKIN: Warm and dry NEUROLOGIC:  Alert and oriented x 3 PSYCHIATRIC:  Normal affect   ASSESSMENT & PLAN:    1. OSA - The patient is tolerating PAP therapy well without any problems. The PAP download performed by his DME was personally reviewed and interpreted by me today and showed an AHI of 6.1/hr on auto CPAP from 4 to 20 cm H2O with 97% compliance in using more than 4 hours nightly.  The patient has been using and benefiting from PAP use and will continue to benefit from therapy.  -I encouraged him to try to avoid sleeping supine as much as he can   Medication Adjustments/Labs and Tests Ordered: Current medicines are reviewed at length with the patient today.  Concerns regarding medicines are outlined above.  Tests Ordered: No orders of the defined types were placed in this encounter.  Medication Changes: No orders of the defined types were placed in this encounter.   Disposition:  Follow up in 6 weeks  Signed, Fransico Him, MD  06/06/2022 11:24 AM    [place]

## 2022-06-06 NOTE — Patient Instructions (Signed)
Medication Instructions:  Your physician recommends that you continue on your current medications as directed. Please refer to the Current Medication list given to you today.  *If you need a refill on your cardiac medications before your next appointment, please call your pharmacy*   Lab Work: None.  If you have labs (blood work) drawn today and your tests are completely normal, you will receive your results only by: MyChart Message (if you have MyChart) OR A paper copy in the mail If you have any lab test that is abnormal or we need to change your treatment, we will call you to review the results.   Testing/Procedures: None.   Follow-Up: At [facility], you and your health needs are our priority.  As part of our continuing mission to provide you with exceptional heart care, we have created designated Provider Care Teams.  These Care Teams include your primary Cardiologist (physician) and Advanced Practice Providers (APPs -  Physician Assistants and Nurse Practitioners) who all work together to provide you with the care you need, when you need it.  We recommend signing up for the patient portal called "MyChart".  Sign up information is provided on this After Visit Summary.  MyChart is used to connect with patients for Virtual Visits (Telemedicine).  Patients are able to view lab/test results, encounter notes, upcoming appointments, etc.  Non-urgent messages can be sent to your provider as well.   To learn more about what you can do with MyChart, go to [email or web address].    Your next appointment:   1 year(s)  Provider:   Traci Turner, MD     

## 2022-06-21 DIAGNOSIS — G4733 Obstructive sleep apnea (adult) (pediatric): Secondary | ICD-10-CM | POA: Diagnosis not present

## 2022-07-02 DIAGNOSIS — H26493 Other secondary cataract, bilateral: Secondary | ICD-10-CM | POA: Diagnosis not present

## 2022-07-02 DIAGNOSIS — Z961 Presence of intraocular lens: Secondary | ICD-10-CM | POA: Diagnosis not present

## 2022-07-13 ENCOUNTER — Ambulatory Visit: Payer: PPO | Admitting: Cardiology

## 2022-07-20 DIAGNOSIS — G4733 Obstructive sleep apnea (adult) (pediatric): Secondary | ICD-10-CM | POA: Diagnosis not present

## 2022-07-23 DIAGNOSIS — L814 Other melanin hyperpigmentation: Secondary | ICD-10-CM | POA: Diagnosis not present

## 2022-07-23 DIAGNOSIS — Z85828 Personal history of other malignant neoplasm of skin: Secondary | ICD-10-CM | POA: Diagnosis not present

## 2022-07-23 DIAGNOSIS — D225 Melanocytic nevi of trunk: Secondary | ICD-10-CM | POA: Diagnosis not present

## 2022-07-23 DIAGNOSIS — L578 Other skin changes due to chronic exposure to nonionizing radiation: Secondary | ICD-10-CM | POA: Diagnosis not present

## 2022-07-23 DIAGNOSIS — L570 Actinic keratosis: Secondary | ICD-10-CM | POA: Diagnosis not present

## 2022-07-23 DIAGNOSIS — D485 Neoplasm of uncertain behavior of skin: Secondary | ICD-10-CM | POA: Diagnosis not present

## 2022-07-23 DIAGNOSIS — L821 Other seborrheic keratosis: Secondary | ICD-10-CM | POA: Diagnosis not present

## 2022-07-24 NOTE — Telephone Encounter (Signed)
DME changed from [place] to [place] per choice and patient.

## 2022-08-07 DIAGNOSIS — G4733 Obstructive sleep apnea (adult) (pediatric): Secondary | ICD-10-CM | POA: Diagnosis not present

## 2022-08-20 DIAGNOSIS — G4733 Obstructive sleep apnea (adult) (pediatric): Secondary | ICD-10-CM | POA: Diagnosis not present

## 2022-08-27 DIAGNOSIS — L570 Actinic keratosis: Secondary | ICD-10-CM | POA: Diagnosis not present

## 2022-09-19 DIAGNOSIS — G4733 Obstructive sleep apnea (adult) (pediatric): Secondary | ICD-10-CM | POA: Diagnosis not present

## 2022-10-09 DIAGNOSIS — H6123 Impacted cerumen, bilateral: Secondary | ICD-10-CM | POA: Diagnosis not present

## 2022-10-20 DIAGNOSIS — G4733 Obstructive sleep apnea (adult) (pediatric): Secondary | ICD-10-CM | POA: Diagnosis not present

## 2022-11-19 DIAGNOSIS — G4733 Obstructive sleep apnea (adult) (pediatric): Secondary | ICD-10-CM | POA: Diagnosis not present

## 2022-12-12 DIAGNOSIS — E7800 Pure hypercholesterolemia, unspecified: Secondary | ICD-10-CM | POA: Diagnosis not present

## 2022-12-12 DIAGNOSIS — R7301 Impaired fasting glucose: Secondary | ICD-10-CM | POA: Diagnosis not present

## 2022-12-12 DIAGNOSIS — Z125 Encounter for screening for malignant neoplasm of prostate: Secondary | ICD-10-CM | POA: Diagnosis not present

## 2022-12-12 DIAGNOSIS — Z79899 Other long term (current) drug therapy: Secondary | ICD-10-CM | POA: Diagnosis not present

## 2022-12-14 DIAGNOSIS — Z79899 Other long term (current) drug therapy: Secondary | ICD-10-CM | POA: Diagnosis not present

## 2022-12-14 DIAGNOSIS — R7301 Impaired fasting glucose: Secondary | ICD-10-CM | POA: Diagnosis not present

## 2022-12-14 DIAGNOSIS — I77810 Thoracic aortic ectasia: Secondary | ICD-10-CM | POA: Diagnosis not present

## 2022-12-14 DIAGNOSIS — Z0001 Encounter for general adult medical examination with abnormal findings: Secondary | ICD-10-CM | POA: Diagnosis not present

## 2022-12-14 DIAGNOSIS — E7800 Pure hypercholesterolemia, unspecified: Secondary | ICD-10-CM | POA: Diagnosis not present

## 2022-12-18 ENCOUNTER — Other Ambulatory Visit: Payer: Self-pay | Admitting: Family Medicine

## 2022-12-18 DIAGNOSIS — I77810 Thoracic aortic ectasia: Secondary | ICD-10-CM

## 2022-12-20 DIAGNOSIS — G4733 Obstructive sleep apnea (adult) (pediatric): Secondary | ICD-10-CM | POA: Diagnosis not present

## 2023-01-07 ENCOUNTER — Ambulatory Visit
Admission: RE | Admit: 2023-01-07 | Discharge: 2023-01-07 | Disposition: A | Discharge status: Home / Self Care | Payer: PPO | Source: Ambulatory Visit | Attending: Family Medicine | Admitting: Family Medicine

## 2023-01-07 DIAGNOSIS — I77810 Thoracic aortic ectasia: Secondary | ICD-10-CM

## 2023-01-07 DIAGNOSIS — K760 Fatty (change of) liver, not elsewhere classified: Secondary | ICD-10-CM | POA: Diagnosis not present

## 2023-01-07 DIAGNOSIS — I2584 Coronary atherosclerosis due to calcified coronary lesion: Secondary | ICD-10-CM | POA: Diagnosis not present

## 2023-01-07 MED ORDER — IOPAMIDOL (ISOVUE-300) INJECTION 61%
75.0000 mL | Freq: Once | INTRAVENOUS | Status: AC | PRN
  Administered 2023-01-07: 75 mL via INTRAVENOUS

## 2023-01-20 DIAGNOSIS — G4733 Obstructive sleep apnea (adult) (pediatric): Secondary | ICD-10-CM | POA: Diagnosis not present

## 2023-01-23 DIAGNOSIS — G4733 Obstructive sleep apnea (adult) (pediatric): Secondary | ICD-10-CM | POA: Diagnosis not present

## 2023-02-12 DIAGNOSIS — Z23 Encounter for immunization: Secondary | ICD-10-CM | POA: Diagnosis not present

## 2023-02-19 DIAGNOSIS — G4733 Obstructive sleep apnea (adult) (pediatric): Secondary | ICD-10-CM | POA: Diagnosis not present

## 2023-02-26 DIAGNOSIS — L570 Actinic keratosis: Secondary | ICD-10-CM | POA: Diagnosis not present

## 2023-02-26 DIAGNOSIS — C4442 Squamous cell carcinoma of skin of scalp and neck: Secondary | ICD-10-CM | POA: Diagnosis not present

## 2023-02-26 DIAGNOSIS — L309 Dermatitis, unspecified: Secondary | ICD-10-CM | POA: Diagnosis not present

## 2023-02-26 DIAGNOSIS — D485 Neoplasm of uncertain behavior of skin: Secondary | ICD-10-CM | POA: Diagnosis not present

## 2023-03-22 DIAGNOSIS — G4733 Obstructive sleep apnea (adult) (pediatric): Secondary | ICD-10-CM | POA: Diagnosis not present

## 2023-04-02 DIAGNOSIS — C4442 Squamous cell carcinoma of skin of scalp and neck: Secondary | ICD-10-CM | POA: Diagnosis not present

## 2023-04-09 DIAGNOSIS — H6123 Impacted cerumen, bilateral: Secondary | ICD-10-CM | POA: Diagnosis not present

## 2023-04-21 DIAGNOSIS — G4733 Obstructive sleep apnea (adult) (pediatric): Secondary | ICD-10-CM | POA: Diagnosis not present

## 2023-07-22 ENCOUNTER — Ambulatory Visit: Payer: PPO | Attending: Cardiology | Admitting: Cardiology

## 2023-07-22 ENCOUNTER — Encounter: Payer: Self-pay | Admitting: Cardiology

## 2023-07-22 VITALS — BP 120/62 | HR 68 | Ht 68.0 in | Wt 176.0 lb

## 2023-07-22 DIAGNOSIS — G4733 Obstructive sleep apnea (adult) (pediatric): Secondary | ICD-10-CM

## 2023-07-22 NOTE — Progress Notes (Signed)
 Date:  07/22/2023   ID:  Zachary Dull., DOB 04/28/1951, MRN [id number]  PCP:  Darrow Bussing, MD  Sleep Medicine:  Armanda Magic, MD  Electrophysiologist:  None   Chief Complaint:  OSA  History of Present Illness:    Zachary Naclerio. is a 73 y.o. male with a history of OSA who has been on CPAP therapy.  At last OV his CPAP was changed from a set pressure of 13cm H2O to auto CPAP from 4 -13cm H2O.  At last OV his AHI was too high so I increased his auto CPAP to 4-20cm H2O.    He is doing well with his PAP device and thinks that he has gotten used to it.  He has some nights when he will take his mask off during sleep and is unaware of it.  He tolerates the mask and feels the pressure is adequate.  Since going on PAP he feels rested in the am and has no significant daytime sleepiness.  He denies any significant mouth or nasal dryness or nasal congestion.  He does not think that he snores.    Prior CV studies:   The following studies were reviewed today:  PAP compliance download  Past Medical History:  Diagnosis Date   Adenomatous colon polyp    Dr Ewing Schlein 2004   Anxiety    social    Arthritis    Depression    at one point in his life   GERD (gastroesophageal reflux disease)    occasionally, take OTC meds x 14 days   Gout of big toe    in right foot   Hemorrhoid    Hypercholesterolemia    Kidney stones, calcium oxalate    OSA on CPAP    Ruptured lumbar disc 2017   treated with - epidural injection, , treated with Dr. Shon Baton & Ramos   Seasonal allergies    Wears glasses    Past Surgical History:  Procedure Laterality Date   COLONOSCOPY     ETHMOIDECTOMY N/A 04/03/2013   Procedure: ETHMOIDECTOMY;  Surgeon: Serena Colonel, MD;  Location: [facility];  Service: ENT;  Laterality: N/A;   FINGER TENDON REPAIR  2008   and arteries-lt middle finger   KIDNEY STONE SURGERY  1996   calcium oxalate kidney stone   MASS EXCISION Left 09/10/2018   Procedure:  Left long finger debridement and drainage, mass removal;  Surgeon: Bradly Bienenstock, MD;  Location: [facility];  Service: Orthopedics;  Laterality: Left;    NASAL SINUS SURGERY  1996   POLYPECTOMY Bilateral 04/03/2013   Procedure: BILATERAL NASAL POLYPECTOMY ;  Surgeon: Serena Colonel, MD;  Location:  SUR[facility]ERY [facility];  Service: ENT;  Laterality: Bilateral;   TOTAL SHOULDER ARTHROPLASTY Left 03/09/2016   Procedure: LEFT TOTAL SHOULDER ARTHROPLASTY;  Surgeon: Beverely Low, MD;  [facility];  Service: Orthopedics;  Laterality: Left;     Current Meds  Medication Sig   allopurinol (ZYLOPRIM) 100 MG tablet Take 100 mg by mouth daily.   azelastine (ASTELIN) 137 MCG/SPRAY nasal spray Place 2 sprays into both nostrils 2 (two) times daily as needed for allergies. Use in each nostril as directed   diclofenac sodium (VOLTAREN) 1 % GEL Apply 2 g topically daily as needed (pain).   Multiple Vitamin (MULTIVITAMIN) tablet Take 1 tablet by mouth daily.   rosuvastatin (CRESTOR) 5 MG tablet Take 5 mg by mouth daily.   tiZANidine (ZANAFLEX) 2 MG tablet Take  2 mg by mouth as directed.   Triprolidine-Pseudoephedrine (ANTIHISTAMINE PO) Take 1 tablet by mouth as directed.   VITAMIN D PO Take 4,000 Units by mouth daily.     Allergies:   Wasp venom, Percocet [oxycodone-acetaminophen], and Penicillins   Social History   Tobacco Use   Smoking status: Former    Types: Cigarettes   Smokeless tobacco: Never  Vaping Use   Vaping status: Never Used  Substance Use Topics   Alcohol use: Yes    Comment: 2 drinks daily - lessening intake in past 2 mths.    Drug use: No     Family Hx: The patient's family history includes Alzheimer's disease in his father; Hypertension in his sister; Leukemia in his mother.  ROS:   Please see the history of present illness.     All other systems reviewed and are negative.   Labs/Other Tests and Data Reviewed:    Recent Labs: No results found for  requested labs within last 365 days.   Recent Lipid Panel No results found for: "CHOL", "TRIG", "HDL", "CHOLHDL", "LDLCALC", "LDLDIRECT"  Wt Readings from Last 3 Encounters:  07/22/23 176 lb (79.8 kg)  06/06/22 174 lb (78.9 kg)  01/24/22 174 lb (78.9 kg)     Objective:    Vital Signs:  BP 120/62   Pulse 68   Ht 5\' 8"  (1.727 m)   Wt 176 lb (79.8 kg)   SpO2 99%   BMI 26.76 kg/m    GEN: Well nourished, well developed in no acute distress HEENT: Normal NECK: No JVD; No carotid bruits LYMPHATICS: No lymphadenopathy CARDIAC:RRR, no murmurs, rubs, gallops RESPIRATORY:  Clear to auscultation without rales, wheezing or rhonchi  ABDOMEN: Soft, non-tender, non-distended MUSCULOSKELETAL:  No edema; No deformity  SKIN: Warm and dry NEUROLOGIC:  Alert and oriented x 3 PSYCHIATRIC:  Normal affect   ASSESSMENT & PLAN:    OSA - The patient is tolerating PAP therapy well without any problems. The PAP download performed by his DME was personally reviewed and interpreted by me today and showed an AHI of 3/hr on auto CPAP from 4 to 20 cm H2O with 73% compliance in using more than 4 hours nightly.  The patient has been using and benefiting from PAP use and will continue to benefit from therapy.    Medication Adjustments/Labs and Tests Ordered: Current medicines are reviewed at length with the patient today.  Concerns regarding medicines are outlined above.  Tests Ordered: No orders of the defined types were placed in this encounter.  Medication Changes: No orders of the defined types were placed in this encounter.   Disposition:  Follow up in 6 weeks  Signed, Armanda Magic, MD  07/22/2023 2:24 PM    [facility]

## 2023-07-22 NOTE — Patient Instructions (Signed)
 Medication Instructions:  The current medical regimen is effective;  continue present plan and medications.  *If you need a refill on your cardiac medications before your next appointment, please call your pharmacy*  Follow-Up: At [facility], you and your health needs are our priority.  As part of our continuing mission to provide you with exceptional heart care, we have created designated Provider Care Teams.  These Care Teams include your primary Cardiologist (physician) and Advanced Practice Providers (APPs -  Physician Assistants and Nurse Practitioners) who all work together to provide you with the care you need, when you need it.  We recommend signing up for the patient portal called "MyChart".  Sign up information is provided on this After Visit Summary.  MyChart is used to connect with patients for Virtual Visits (Telemedicine).  Patients are able to view lab/test results, encounter notes, upcoming appointments, etc.  Non-urgent messages can be sent to your provider as well.   To learn more about what you can do with MyChart, go to [email or web address].    Your next appointment:   1 year(s)  Provider:   Armanda Magic, MD       1st Floor: - Lobby - Registration  - Pharmacy  - Lab - Cafe  2nd Floor: - PV Lab - Diagnostic Testing (echo, CT, nuclear med)  3rd Floor: - Vacant  4th Floor: - TCTS (cardiothoracic surgery) - AFib Clinic - Structural Heart Clinic - Vascular Surgery  - Vascular Ultrasound  5th Floor: - HeartCare Cardiology (general and EP) - Clinical Pharmacy for coumadin, hypertension, lipid, weight-loss medications, and med management appointments    Valet parking services will be available as well.

## 2023-07-26 DIAGNOSIS — H903 Sensorineural hearing loss, bilateral: Secondary | ICD-10-CM | POA: Diagnosis not present

## 2023-08-07 DIAGNOSIS — L821 Other seborrheic keratosis: Secondary | ICD-10-CM | POA: Diagnosis not present

## 2023-08-07 DIAGNOSIS — L814 Other melanin hyperpigmentation: Secondary | ICD-10-CM | POA: Diagnosis not present

## 2023-08-07 DIAGNOSIS — L578 Other skin changes due to chronic exposure to nonionizing radiation: Secondary | ICD-10-CM | POA: Diagnosis not present

## 2023-08-07 DIAGNOSIS — L309 Dermatitis, unspecified: Secondary | ICD-10-CM | POA: Diagnosis not present

## 2023-08-07 DIAGNOSIS — Z85828 Personal history of other malignant neoplasm of skin: Secondary | ICD-10-CM | POA: Diagnosis not present

## 2023-08-07 DIAGNOSIS — D225 Melanocytic nevi of trunk: Secondary | ICD-10-CM | POA: Diagnosis not present

## 2023-09-13 DIAGNOSIS — H26493 Other secondary cataract, bilateral: Secondary | ICD-10-CM | POA: Diagnosis not present

## 2023-09-13 DIAGNOSIS — H524 Presbyopia: Secondary | ICD-10-CM | POA: Diagnosis not present

## 2023-09-13 DIAGNOSIS — Z961 Presence of intraocular lens: Secondary | ICD-10-CM | POA: Diagnosis not present

## 2023-09-19 DIAGNOSIS — G4733 Obstructive sleep apnea (adult) (pediatric): Secondary | ICD-10-CM | POA: Diagnosis not present

## 2023-09-26 DIAGNOSIS — H6123 Impacted cerumen, bilateral: Secondary | ICD-10-CM | POA: Diagnosis not present

## 2023-10-11 DIAGNOSIS — R1319 Other dysphagia: Secondary | ICD-10-CM | POA: Diagnosis not present

## 2023-10-11 DIAGNOSIS — E7800 Pure hypercholesterolemia, unspecified: Secondary | ICD-10-CM | POA: Diagnosis not present

## 2023-10-30 ENCOUNTER — Other Ambulatory Visit: Payer: Self-pay | Admitting: Family Medicine

## 2023-10-30 DIAGNOSIS — R1319 Other dysphagia: Secondary | ICD-10-CM

## 2023-11-05 DIAGNOSIS — K635 Polyp of colon: Secondary | ICD-10-CM | POA: Diagnosis not present

## 2023-11-05 DIAGNOSIS — K649 Unspecified hemorrhoids: Secondary | ICD-10-CM | POA: Diagnosis not present

## 2023-11-05 DIAGNOSIS — D128 Benign neoplasm of rectum: Secondary | ICD-10-CM | POA: Diagnosis not present

## 2023-11-05 DIAGNOSIS — Z09 Encounter for follow-up examination after completed treatment for conditions other than malignant neoplasm: Secondary | ICD-10-CM | POA: Diagnosis not present

## 2023-11-05 DIAGNOSIS — Z860101 Personal history of adenomatous and serrated colon polyps: Secondary | ICD-10-CM | POA: Diagnosis not present

## 2023-11-05 DIAGNOSIS — K5730 Diverticulosis of large intestine without perforation or abscess without bleeding: Secondary | ICD-10-CM | POA: Diagnosis not present

## 2023-11-07 DIAGNOSIS — R1319 Other dysphagia: Secondary | ICD-10-CM | POA: Diagnosis not present

## 2023-11-08 DIAGNOSIS — D128 Benign neoplasm of rectum: Secondary | ICD-10-CM | POA: Diagnosis not present

## 2023-11-11 DIAGNOSIS — G4733 Obstructive sleep apnea (adult) (pediatric): Secondary | ICD-10-CM | POA: Diagnosis not present

## 2023-11-12 ENCOUNTER — Ambulatory Visit
Admission: RE | Admit: 2023-11-12 | Discharge: 2023-11-12 | Disposition: A | Discharge status: Home / Self Care | Source: Ambulatory Visit | Attending: Family Medicine | Admitting: Family Medicine

## 2023-11-12 DIAGNOSIS — R1310 Dysphagia, unspecified: Secondary | ICD-10-CM | POA: Diagnosis not present

## 2023-11-12 DIAGNOSIS — R1319 Other dysphagia: Secondary | ICD-10-CM

## 2023-11-12 DIAGNOSIS — H449 Unspecified disorder of globe: Secondary | ICD-10-CM | POA: Diagnosis not present

## 2023-11-12 DIAGNOSIS — K222 Esophageal obstruction: Secondary | ICD-10-CM | POA: Diagnosis not present

## 2023-12-26 DIAGNOSIS — R1311 Dysphagia, oral phase: Secondary | ICD-10-CM | POA: Diagnosis not present

## 2023-12-26 DIAGNOSIS — K222 Esophageal obstruction: Secondary | ICD-10-CM | POA: Diagnosis not present

## 2023-12-30 DIAGNOSIS — E7800 Pure hypercholesterolemia, unspecified: Secondary | ICD-10-CM | POA: Diagnosis not present

## 2023-12-30 DIAGNOSIS — M109 Gout, unspecified: Secondary | ICD-10-CM | POA: Diagnosis not present

## 2023-12-30 DIAGNOSIS — Z79899 Other long term (current) drug therapy: Secondary | ICD-10-CM | POA: Diagnosis not present

## 2023-12-30 DIAGNOSIS — R7301 Impaired fasting glucose: Secondary | ICD-10-CM | POA: Diagnosis not present

## 2024-01-01 DIAGNOSIS — R7301 Impaired fasting glucose: Secondary | ICD-10-CM | POA: Diagnosis not present

## 2024-01-01 DIAGNOSIS — M109 Gout, unspecified: Secondary | ICD-10-CM | POA: Diagnosis not present

## 2024-01-01 DIAGNOSIS — E7800 Pure hypercholesterolemia, unspecified: Secondary | ICD-10-CM | POA: Diagnosis not present

## 2024-01-01 DIAGNOSIS — Z79899 Other long term (current) drug therapy: Secondary | ICD-10-CM | POA: Diagnosis not present

## 2024-01-01 DIAGNOSIS — Z1331 Encounter for screening for depression: Secondary | ICD-10-CM | POA: Diagnosis not present

## 2024-01-01 DIAGNOSIS — Z0000 Encounter for general adult medical examination without abnormal findings: Secondary | ICD-10-CM | POA: Diagnosis not present

## 2024-02-04 DIAGNOSIS — Z23 Encounter for immunization: Secondary | ICD-10-CM | POA: Diagnosis not present

## 2024-02-07 DIAGNOSIS — Z974 Presence of external hearing-aid: Secondary | ICD-10-CM | POA: Diagnosis not present

## 2024-02-07 DIAGNOSIS — H6123 Impacted cerumen, bilateral: Secondary | ICD-10-CM | POA: Diagnosis not present

## 2024-02-10 DIAGNOSIS — R1310 Dysphagia, unspecified: Secondary | ICD-10-CM | POA: Diagnosis not present

## 2024-02-10 DIAGNOSIS — K222 Esophageal obstruction: Secondary | ICD-10-CM | POA: Diagnosis not present

## 2024-02-10 DIAGNOSIS — K3189 Other diseases of stomach and duodenum: Secondary | ICD-10-CM | POA: Diagnosis not present

## 2024-02-10 DIAGNOSIS — K449 Diaphragmatic hernia without obstruction or gangrene: Secondary | ICD-10-CM | POA: Diagnosis not present

## 2024-02-10 DIAGNOSIS — R933 Abnormal findings on diagnostic imaging of other parts of digestive tract: Secondary | ICD-10-CM | POA: Diagnosis not present

## 2024-03-26 DIAGNOSIS — H6123 Impacted cerumen, bilateral: Secondary | ICD-10-CM | POA: Diagnosis not present

## 2024-03-26 DIAGNOSIS — L299 Pruritus, unspecified: Secondary | ICD-10-CM | POA: Diagnosis not present

## 2024-04-02 DIAGNOSIS — G4733 Obstructive sleep apnea (adult) (pediatric): Secondary | ICD-10-CM | POA: Diagnosis not present

## 2024-04-02 DIAGNOSIS — Z79899 Other long term (current) drug therapy: Secondary | ICD-10-CM | POA: Diagnosis not present
